# Patient Record
Sex: Male | Born: 1970 | Race: Black or African American | Hispanic: No | Marital: Single | Smoking: Current every day smoker
Health system: Southern US, Community
[De-identification: ages and names within clinical notes are randomized; demographics above are authoritative.]

## PROBLEM LIST (undated history)

## (undated) ENCOUNTER — Emergency Department: Admission: EM | Payer: No Typology Code available for payment source

## (undated) ENCOUNTER — Emergency Department: Admission: EM | Discharge status: Absent without leave | Payer: No Typology Code available for payment source

## (undated) DIAGNOSIS — Z5900 Homelessness unspecified: Secondary | ICD-10-CM

## (undated) DIAGNOSIS — F329 Major depressive disorder, single episode, unspecified: Secondary | ICD-10-CM

## (undated) DIAGNOSIS — F319 Bipolar disorder, unspecified: Secondary | ICD-10-CM

## (undated) DIAGNOSIS — E119 Type 2 diabetes mellitus without complications: Secondary | ICD-10-CM

## (undated) DIAGNOSIS — F32A Depression, unspecified: Secondary | ICD-10-CM

## (undated) DIAGNOSIS — F209 Schizophrenia, unspecified: Secondary | ICD-10-CM

## (undated) DIAGNOSIS — J45909 Unspecified asthma, uncomplicated: Secondary | ICD-10-CM

## (undated) DIAGNOSIS — F419 Anxiety disorder, unspecified: Secondary | ICD-10-CM

---

## 2000-01-13 ENCOUNTER — Emergency Department (HOSPITAL_COMMUNITY): Admission: EM | Admit: 2000-01-13 | Discharge: 2000-01-13 | Payer: Self-pay | Admitting: Emergency Medicine

## 2000-04-15 ENCOUNTER — Inpatient Hospital Stay (HOSPITAL_COMMUNITY): Admission: EM | Admit: 2000-04-15 | Discharge: 2000-04-17 | Payer: Self-pay | Admitting: Emergency Medicine

## 2000-04-15 ENCOUNTER — Encounter: Payer: Self-pay | Admitting: Emergency Medicine

## 2000-04-17 ENCOUNTER — Encounter: Payer: Self-pay | Admitting: Internal Medicine

## 2000-10-14 ENCOUNTER — Emergency Department (HOSPITAL_COMMUNITY): Admission: EM | Admit: 2000-10-14 | Discharge: 2000-10-15 | Payer: Self-pay | Admitting: Emergency Medicine

## 2000-10-15 ENCOUNTER — Encounter: Payer: Self-pay | Admitting: Emergency Medicine

## 2000-12-17 ENCOUNTER — Emergency Department (HOSPITAL_COMMUNITY): Admission: EM | Admit: 2000-12-17 | Discharge: 2000-12-17 | Payer: Self-pay

## 2001-02-01 ENCOUNTER — Emergency Department (HOSPITAL_COMMUNITY): Admission: EM | Admit: 2001-02-01 | Discharge: 2001-02-02 | Payer: Self-pay | Admitting: Emergency Medicine

## 2001-02-02 ENCOUNTER — Encounter: Payer: Self-pay | Admitting: Emergency Medicine

## 2002-12-17 ENCOUNTER — Emergency Department (HOSPITAL_COMMUNITY): Admission: EM | Admit: 2002-12-17 | Discharge: 2002-12-17 | Payer: Self-pay | Admitting: Emergency Medicine

## 2003-08-05 ENCOUNTER — Encounter: Payer: Self-pay | Admitting: Family Medicine

## 2003-08-05 ENCOUNTER — Ambulatory Visit (HOSPITAL_COMMUNITY): Admission: RE | Admit: 2003-08-05 | Discharge: 2003-08-05 | Payer: Self-pay | Admitting: Family Medicine

## 2005-06-06 ENCOUNTER — Emergency Department (HOSPITAL_COMMUNITY): Admission: EM | Admit: 2005-06-06 | Discharge: 2005-06-06 | Payer: Self-pay | Admitting: Emergency Medicine

## 2007-01-11 ENCOUNTER — Emergency Department (HOSPITAL_COMMUNITY): Admission: EM | Admit: 2007-01-11 | Discharge: 2007-01-11 | Payer: Self-pay | Admitting: Emergency Medicine

## 2007-01-14 ENCOUNTER — Emergency Department (HOSPITAL_COMMUNITY): Admission: EM | Admit: 2007-01-14 | Discharge: 2007-01-15 | Payer: Self-pay | Admitting: Emergency Medicine

## 2007-01-16 ENCOUNTER — Emergency Department (HOSPITAL_COMMUNITY): Admission: EM | Admit: 2007-01-16 | Discharge: 2007-01-16 | Payer: Self-pay | Admitting: Certified Registered"

## 2007-01-17 ENCOUNTER — Emergency Department (HOSPITAL_COMMUNITY): Admission: EM | Admit: 2007-01-17 | Discharge: 2007-01-17 | Payer: Self-pay | Admitting: Emergency Medicine

## 2007-01-22 ENCOUNTER — Encounter: Admission: RE | Admit: 2007-01-22 | Discharge: 2007-01-22 | Payer: Self-pay | Admitting: Family Medicine

## 2013-06-11 ENCOUNTER — Encounter (HOSPITAL_COMMUNITY): Payer: Self-pay | Admitting: Emergency Medicine

## 2013-06-11 ENCOUNTER — Emergency Department (HOSPITAL_COMMUNITY)
Admission: EM | Admit: 2013-06-11 | Discharge: 2013-06-11 | Disposition: A | Discharge status: Home / Self Care | Payer: Medicare Other | Attending: Emergency Medicine | Admitting: Emergency Medicine

## 2013-06-11 DIAGNOSIS — F411 Generalized anxiety disorder: Secondary | ICD-10-CM | POA: Insufficient documentation

## 2013-06-11 DIAGNOSIS — B353 Tinea pedis: Secondary | ICD-10-CM

## 2013-06-11 DIAGNOSIS — Z79899 Other long term (current) drug therapy: Secondary | ICD-10-CM | POA: Insufficient documentation

## 2013-06-11 DIAGNOSIS — L299 Pruritus, unspecified: Secondary | ICD-10-CM | POA: Insufficient documentation

## 2013-06-11 DIAGNOSIS — F17200 Nicotine dependence, unspecified, uncomplicated: Secondary | ICD-10-CM | POA: Insufficient documentation

## 2013-06-11 DIAGNOSIS — F209 Schizophrenia, unspecified: Secondary | ICD-10-CM | POA: Insufficient documentation

## 2013-06-11 DIAGNOSIS — R630 Anorexia: Secondary | ICD-10-CM | POA: Insufficient documentation

## 2013-06-11 DIAGNOSIS — L988 Other specified disorders of the skin and subcutaneous tissue: Secondary | ICD-10-CM | POA: Insufficient documentation

## 2013-06-11 DIAGNOSIS — R634 Abnormal weight loss: Secondary | ICD-10-CM | POA: Insufficient documentation

## 2013-06-11 HISTORY — DX: Anxiety disorder, unspecified: F41.9

## 2013-06-11 MED ORDER — MICONAZOLE NITRATE 2 % EX CREA
1.0000 "application " | TOPICAL_CREAM | Freq: Two times a day (BID) | CUTANEOUS | Status: DC
  Administered 2013-06-11: 1 via TOPICAL
  Filled 2013-06-11: qty 14

## 2013-06-11 MED ORDER — HYDROCODONE-ACETAMINOPHEN 5-325 MG PO TABS
1.0000 | ORAL_TABLET | Freq: Once | ORAL | Status: AC
  Administered 2013-06-11: 1 via ORAL
  Filled 2013-06-11: qty 1

## 2013-06-11 NOTE — ED Notes (Signed)
PT. REPORTS BILATERAL FOOT PAIN FOR 9 MONTHS , AMBULATORY , DENIES INJURY OR FALL , ALSO REPORTS DRY / CRACKED / ITCHY SKIN ( FUNGUS ) AT BOTH FEET.

## 2013-06-11 NOTE — ED Provider Notes (Signed)
CSN: [id number]     Arrival date & time 06/11/13  2107 History    This chart was scribed for Hunter Morn, NP working with Lyanne Co, MD by Quintella Reichert, ED Scribe. This patient was seen in room TR07C/TR07C and the patient's care was started at 10:14 PM.     Chief Complaint  Patient presents with  . Foot Pain    Patient is a 42 y.o. male presenting with lower extremity pain. The history is provided by the patient. No language interpreter was used.  Foot Pain This is a new problem. Episode onset: 9 months ago. The problem occurs constantly. The problem has been gradually worsening. Associated symptoms comments: Dry, flaking, itching and cracked skin. Exacerbated by: Applying pressure to the soles and bearing weight. Nothing relieves the symptoms. Treatments tried: Epsom salt soak. The treatment provided no relief.    HPI Comments: Hunter Monroe is a 42 y.o. male with h/o anxiety and schizophrenia who presents to the Emergency Department complaining of 9 months of pain to the soles of both feet.  Pt states that the areas are tender, with pain exacerbated by applying pressure and by bearing weight.  He is ambulatory.  Pt also reports dry, cracked, itchy and flaking skin at the bottom of both feet.  He has attempted to treat symptoms with epsom salt, without relief.  He denies prior h/o similar symptoms.  He is currently homeless and states he walks long distances regularly, and admits to occasionally allowing his feet to stay wet.  He denies h/o fungal infections on feet.  Pt also notes he has had a decreased appetite recently and has been losing weight.  He is not sexually active.  He medicates regularly with Lexapro and Latuda for his psychiatric conditions.  He denies any other chronic medical conditions to his knowledge.     Past Medical History  Diagnosis Date  . Anxiety     History reviewed. No pertinent past surgical history.   No family history on file.   History   Substance Use Topics  . Smoking status: Current Every Day Smoker  . Smokeless tobacco: Not on file  . Alcohol Use: No     Review of Systems  Constitutional: Positive for appetite change and unexpected weight change.  Musculoskeletal:       Pain to soles of feet  Skin:       Dried, flaking, itchy cracked skin on bottoms of feet  All other systems reviewed and are negative.      Allergies  Review of patient's allergies indicates no known allergies.  Home Medications   Current Outpatient Rx  Name  Route  Sig  Dispense  Refill  . escitalopram (LEXAPRO) 20 MG tablet   Oral   Take 20 mg by mouth daily.         . Lurasidone HCl (LATUDA) 20 MG TABS   Oral   Take 20 mg by mouth daily.          BP 128/73  Pulse 70  Temp(Src) 98.8 F (37.1 C) (Oral)  Resp 16  SpO2 98%  Physical Exam  Nursing note and vitals reviewed. Constitutional: He is oriented to person, place, and time. He appears well-developed and well-nourished. No distress.  HENT:  Head: Normocephalic and atraumatic.  Eyes: EOM are normal.  Neck: Neck supple. No tracheal deviation present.  Cardiovascular: Normal rate.   Pulmonary/Chest: Effort normal. No respiratory distress.  Musculoskeletal: Normal range of motion.  Neurological: He  is alert and oriented to person, place, and time.  Skin: Skin is warm and dry.  Dry scaly skin on soles of feet.  Psychiatric: He has a normal mood and affect. His behavior is normal.    ED Course  Procedures (including critical care time)  DIAGNOSTIC STUDIES: Oxygen Saturation is 98% on room air, normal by my interpretation.    COORDINATION OF CARE: 10:20 PM-Discussed treatment plan which includes topical cream for symptomatic relief and referral to PCP with pt at bedside and pt agreed to plan.    Labs Reviewed - No data to display No results found. No diagnosis found.  MDM  ? Tinea infection of feet.  Will trial miconazole. PCP follow-up.     I  personally performed the services described in this documentation, which was scribed in my presence. The recorded information has been reviewed and is accurate.    Jimmye Norman, NP 06/11/13 [phone]

## 2013-06-14 NOTE — ED Provider Notes (Signed)
Medical screening examination/treatment/procedure(s) were performed by non-physician practitioner and as supervising physician I was immediately available for consultation/collaboration.   Lyanne Co, MD 06/14/13 [phone]

## 2013-08-04 ENCOUNTER — Emergency Department (HOSPITAL_COMMUNITY)
Admission: EM | Admit: 2013-08-04 | Discharge: 2013-08-04 | Disposition: A | Discharge status: Home / Self Care | Payer: Medicare Other | Attending: Emergency Medicine | Admitting: Emergency Medicine

## 2013-08-04 ENCOUNTER — Encounter (HOSPITAL_COMMUNITY): Payer: Self-pay | Admitting: Emergency Medicine

## 2013-08-04 DIAGNOSIS — Z5900 Homelessness unspecified: Secondary | ICD-10-CM | POA: Insufficient documentation

## 2013-08-04 DIAGNOSIS — M722 Plantar fascial fibromatosis: Secondary | ICD-10-CM | POA: Insufficient documentation

## 2013-08-04 DIAGNOSIS — Z79899 Other long term (current) drug therapy: Secondary | ICD-10-CM | POA: Insufficient documentation

## 2013-08-04 DIAGNOSIS — Z8659 Personal history of other mental and behavioral disorders: Secondary | ICD-10-CM | POA: Insufficient documentation

## 2013-08-04 DIAGNOSIS — F17200 Nicotine dependence, unspecified, uncomplicated: Secondary | ICD-10-CM | POA: Insufficient documentation

## 2013-08-04 DIAGNOSIS — J45909 Unspecified asthma, uncomplicated: Secondary | ICD-10-CM | POA: Insufficient documentation

## 2013-08-04 DIAGNOSIS — E119 Type 2 diabetes mellitus without complications: Secondary | ICD-10-CM | POA: Insufficient documentation

## 2013-08-04 DIAGNOSIS — F209 Schizophrenia, unspecified: Secondary | ICD-10-CM | POA: Insufficient documentation

## 2013-08-04 HISTORY — DX: Schizophrenia, unspecified: F20.9

## 2013-08-04 HISTORY — DX: Depression, unspecified: F32.A

## 2013-08-04 HISTORY — DX: Major depressive disorder, single episode, unspecified: F32.9

## 2013-08-04 HISTORY — DX: Unspecified asthma, uncomplicated: J45.909

## 2013-08-04 HISTORY — DX: Homelessness: Z59.0

## 2013-08-04 HISTORY — DX: Homelessness unspecified: Z59.00

## 2013-08-04 HISTORY — DX: Type 2 diabetes mellitus without complications: E11.9

## 2013-08-04 HISTORY — DX: Bipolar disorder, unspecified: F31.9

## 2013-08-04 MED ORDER — IBUPROFEN 400 MG PO TABS
800.0000 mg | ORAL_TABLET | Freq: Once | ORAL | Status: AC
  Administered 2013-08-04: 800 mg via ORAL
  Filled 2013-08-04: qty 2

## 2013-08-04 MED ORDER — MELOXICAM 15 MG PO TABS: 15.0000 mg | ORAL_TABLET | Freq: Every day | ORAL | Status: DC

## 2013-08-04 NOTE — ED Provider Notes (Signed)
Medical screening examination/treatment/procedure(s) were performed by non-physician practitioner and as supervising physician I was immediately available for consultation/collaboration.   Loren Racer, MD 08/04/13 [phone]

## 2013-08-04 NOTE — ED Notes (Signed)
Pt. reports bilateral foot pain for several days , pt. stated he is homeless  and walks a lot .

## 2013-08-04 NOTE — ED Provider Notes (Signed)
CSN: [id number]     Arrival date & time 08/04/13  0017 History   First MD Initiated Contact with Patient 08/04/13 0103     Chief Complaint  Patient presents with  . Foot Pain   HPI  History provided by the patient. The patient is a 42 year old male with past history of schizophrenia, anxiety and asthma who presents with complaints of bilateral foot pain. Patient reports worsening foot pain after walking significant distances. The patient states he recently walked from [place] to [place]. He is homeless and states he does not have a place to live currently. Patient was seen previously in the emergency room for similar symptoms of foot pain. He reports using a cream that he was given without any change in symptoms. He has not been able to take any pills for the pain. Pain is worse with increased walking and movement and better with resting. Denies any other aggravating or alleviating factors. Denies any swelling of the legs and feet. No redness of the skin. No fever, chills or sweats. No other associated symptoms.    Past Medical History  Diagnosis Date  . Anxiety   . Schizophrenia   . Bipolar 1 disorder   . Depression   . Diabetes mellitus without complication   . Asthma   . Homelessness    History reviewed. No pertinent past surgical history. No family history on file. History  Substance Use Topics  . Smoking status: Current Every Day Smoker  . Smokeless tobacco: Not on file  . Alcohol Use: No    Review of Systems  Constitutional: Negative for fever, chills and diaphoresis.  Respiratory: Negative for shortness of breath.   Cardiovascular: Negative for chest pain.  Skin: Negative for rash.  All other systems reviewed and are negative.    Allergies  Review of patient's allergies indicates no known allergies.  Home Medications   Current Outpatient Rx  Name  Route  Sig  Dispense  Refill  . paliperidone (INVEGA) 6 MG 24 hr tablet   Oral   Take 6 mg by mouth every  morning.          BP 103/82  Pulse 94  Temp(Src) 98.7 F (37.1 C) (Oral)  Resp 14  Ht 5\' 7"  (1.702 m)  Wt 129 lb (58.514 kg)  BMI 20.2 kg/m2  SpO2 96% Physical Exam  Nursing note and vitals reviewed. Constitutional: He is oriented to person, place, and time. He appears well-developed and well-nourished. No distress.  HENT:  Head: Normocephalic and atraumatic.  Cardiovascular: Normal rate and regular rhythm.   Pulmonary/Chest: Effort normal and breath sounds normal. No respiratory distress. He has no wheezes. He has no rales.  Abdominal: Soft.  Musculoskeletal: Normal range of motion.  Significant dry and scaling skin of the feet bilaterally. There is some cracking fissuring between the digits. He has significant tenderness along the plantar surface. No significant swelling or erythema. Normal pulses and sensations to the toes.  Neurological: He is alert and oriented to person, place, and time.  Skin: Skin is warm. No erythema.  Psychiatric: He has a normal mood and affect. His behavior is normal.    ED Course  Procedures       MDM   1. Plantar fasciitis      Patient seen and evaluated. Patient appears well. Patient is homeless requesting for a place to stay. We'll plan to provide resources for your by homeless shelters.    Angus Seller, PA-C 08/04/13 0530

## 2015-01-07 ENCOUNTER — Encounter (HOSPITAL_COMMUNITY): Payer: Self-pay | Admitting: Family Medicine

## 2015-01-07 ENCOUNTER — Emergency Department (HOSPITAL_COMMUNITY)
Admission: EM | Admit: 2015-01-07 | Discharge: 2015-01-07 | Disposition: A | Discharge status: Home / Self Care | Payer: Medicare Other | Attending: Emergency Medicine | Admitting: Emergency Medicine

## 2015-01-07 ENCOUNTER — Emergency Department (HOSPITAL_COMMUNITY): Payer: Medicare Other

## 2015-01-07 DIAGNOSIS — Z79899 Other long term (current) drug therapy: Secondary | ICD-10-CM | POA: Diagnosis not present

## 2015-01-07 DIAGNOSIS — F319 Bipolar disorder, unspecified: Secondary | ICD-10-CM | POA: Insufficient documentation

## 2015-01-07 DIAGNOSIS — M79672 Pain in left foot: Secondary | ICD-10-CM | POA: Diagnosis not present

## 2015-01-07 DIAGNOSIS — R21 Rash and other nonspecific skin eruption: Secondary | ICD-10-CM | POA: Diagnosis not present

## 2015-01-07 DIAGNOSIS — F419 Anxiety disorder, unspecified: Secondary | ICD-10-CM | POA: Insufficient documentation

## 2015-01-07 DIAGNOSIS — Z720 Tobacco use: Secondary | ICD-10-CM | POA: Diagnosis not present

## 2015-01-07 DIAGNOSIS — Z590 Homelessness: Secondary | ICD-10-CM | POA: Insufficient documentation

## 2015-01-07 DIAGNOSIS — J45909 Unspecified asthma, uncomplicated: Secondary | ICD-10-CM | POA: Insufficient documentation

## 2015-01-07 DIAGNOSIS — F17200 Nicotine dependence, unspecified, uncomplicated: Secondary | ICD-10-CM

## 2015-01-07 DIAGNOSIS — F209 Schizophrenia, unspecified: Secondary | ICD-10-CM | POA: Insufficient documentation

## 2015-01-07 DIAGNOSIS — E119 Type 2 diabetes mellitus without complications: Secondary | ICD-10-CM | POA: Diagnosis not present

## 2015-01-07 DIAGNOSIS — M79671 Pain in right foot: Secondary | ICD-10-CM | POA: Diagnosis present

## 2015-01-07 LAB — CBC WITH DIFFERENTIAL/PLATELET
BASOS PCT: 0 % (ref 0–1)
Basophils Absolute: 0 10*3/uL (ref 0.0–0.1)
EOS PCT: 2 % (ref 0–5)
Eosinophils Absolute: 0.1 10*3/uL (ref 0.0–0.7)
HEMATOCRIT: 41.2 % (ref 39.0–52.0)
Hemoglobin: 13.3 g/dL (ref 13.0–17.0)
LYMPHS ABS: 2.7 10*3/uL (ref 0.7–4.0)
Lymphocytes Relative: 37 % (ref 12–46)
MCH: 23.4 pg — AB (ref 26.0–34.0)
MCHC: 32.3 g/dL (ref 30.0–36.0)
MCV: 72.5 fL — AB (ref 78.0–100.0)
MONO ABS: 0.5 10*3/uL (ref 0.1–1.0)
MONOS PCT: 7 % (ref 3–12)
NEUTROS ABS: 4 10*3/uL (ref 1.7–7.7)
Neutrophils Relative %: 54 % (ref 43–77)
PLATELETS: 208 10*3/uL (ref 150–400)
RBC: 5.68 MIL/uL (ref 4.22–5.81)
RDW: 14.7 % (ref 11.5–15.5)
WBC: 7.3 10*3/uL (ref 4.0–10.5)

## 2015-01-07 LAB — BASIC METABOLIC PANEL
Anion gap: 10 (ref 5–15)
BUN: 8 mg/dL (ref 6–23)
CALCIUM: 9.7 mg/dL (ref 8.4–10.5)
CO2: 27 mmol/L (ref 19–32)
CREATININE: 0.86 mg/dL (ref 0.50–1.35)
Chloride: 106 mmol/L (ref 96–112)
GFR calc Af Amer: >90 mL/min (ref >90)
GLUCOSE: 95 mg/dL (ref 70–99)
Potassium: 4 mmol/L (ref 3.5–5.1)
Sodium: 143 mmol/L (ref 135–145)

## 2015-01-07 LAB — CBG MONITORING, ED: GLUCOSE-CAPILLARY: 116 mg/dL — AB (ref 70–99)

## 2015-01-07 MED ORDER — MICONAZOLE NITRATE 2 % EX AERP: INHALATION_SPRAY | CUTANEOUS | Status: DC

## 2015-01-07 MED ORDER — HYDROCODONE-ACETAMINOPHEN 5-325 MG PO TABS: ORAL_TABLET | ORAL | Status: DC

## 2015-01-07 MED ORDER — HYDROCODONE-ACETAMINOPHEN 5-325 MG PO TABS
1.0000 | ORAL_TABLET | Freq: Once | ORAL | Status: AC
  Administered 2015-01-07: 1 via ORAL
  Filled 2015-01-07: qty 1

## 2015-01-07 MED ORDER — CEPHALEXIN 500 MG PO CAPS: 500.0000 mg | ORAL_CAPSULE | Freq: Four times a day (QID) | ORAL | Status: DC

## 2015-01-07 NOTE — Discharge Instructions (Signed)
Can walk into the wellness Center to set up primary care. Please see the podiatrist as instructed. Do not hesitate to return to the ED for any new, worsening or concerning symptoms.

## 2015-01-07 NOTE — ED Notes (Addendum)
Pt presents from home via POV with c/o bilateral foot pain with skin color changes to left 4th and 5th toes.  Pt reports was diagnosed with borderline diabetes 3 years ago and has not had follow up since (CBG 116 today). Pt also reports foot pain x3 years, but states skin color darkened on 3rd and 4th toes about 1 month ago. Pedal pulses strong.

## 2015-01-07 NOTE — ED Notes (Signed)
Pt comfortable with discharge and follow up instructions. Pt declines wheelchair, escorted to waiting area by this RN. Prescriptions x3. 

## 2015-01-07 NOTE — Discharge Planning (Signed)
Chloe Bluett J. Clydene Laming, RN, [place], [place] [phone] ED CM consulted to meet with patient concerning f/u care with PCP and patient has Medicare. Pt presented to [facility] ED today with foot pain.  Met with patient at bedside, confirmed informaton. Pt  reports not having access to f/u care with PCP. Discussed with patient importance and benefits of  establishing PCP, and not utilizing the ED for primary care needs. Pt verbalized understanding and is in agreement. Discussed other options, provided list of local  affordable PCPs.  Pt voiced interest in the [facility] and [facility] Brochure given with address, phone number, and the services highlighted. Explained that there is a Customer service manager on site who will assist with [employer] and process. Instructed to call or walk in Monday - Friday between the hours of 9- 5:30p to schedule appt for establishing care for PCP. Pt verbalized understanding.

## 2015-01-07 NOTE — ED Provider Notes (Signed)
CSN: [id number]     Arrival date & time 01/07/15  0920 History   First MD Initiated Contact with Patient 01/07/15 [phone]     Chief Complaint  Patient presents with  . Foot Pain     (Consider location/radiation/quality/duration/timing/severity/associated sxs/prior Treatment) HPI   Hunter Monroe is a 44 y.o. male complaining of bilateral foot pain worse on the left than the right with darkening of the left great toe which she noticed proximally 3 weeks ago, states it has been increasing. States he has mild/moderate pain and ambulation. He is prediabetic, has not followed with primary care, he is insured. He denies numbness, weakness, fever, chills,   Past Medical History  Diagnosis Date  . Anxiety   . Schizophrenia   . Bipolar 1 disorder   . Depression   . Diabetes mellitus without complication   . Asthma   . Homelessness    History reviewed. No pertinent past surgical history. No family history on file. History  Substance Use Topics  . Smoking status: Current Every Day Smoker  . Smokeless tobacco: Not on file  . Alcohol Use: No    Review of Systems  10 systems reviewed and found to be negative, except as noted in the HPI.   Allergies  Review of patient's allergies indicates no known allergies.  Home Medications   Prior to Admission medications   Medication Sig Start Date End Date Taking? Authorizing Provider  buPROPion (WELLBUTRIN XL) 300 MG 24 hr tablet Take 300 mg by mouth daily.   Yes Historical Provider, MD  omega-3 acid ethyl esters (LOVAZA) 1 G capsule Take 1 g by mouth daily.   Yes Historical Provider, MD  paliperidone (INVEGA) 9 MG 24 hr tablet Take 9 mg by mouth every morning.   Yes Historical Provider, MD  cephALEXin (KEFLEX) 500 MG capsule Take 1 capsule (500 mg total) by mouth 4 (four) times daily. 01/07/15   Azra Abrell, PA-C  HYDROcodone-acetaminophen (NORCO/VICODIN) 5-325 MG per tablet Take 1-2 tablets by mouth every 6 hours as needed for pain and/or  cough. 01/07/15   Davante Gerke, PA-C  meloxicam (MOBIC) 15 MG tablet Take 1 tablet (15 mg total) by mouth daily. Patient not taking: Reported on 01/07/2015 08/04/13   Ivonne AndrewPeter Dammen, PA-C  Miconazole Nitrate 2 % AERP As directed in instructions 01/07/15   Joni ReiningNicole Raelene Trew, PA-C   BP 136/87 mmHg  Pulse 70  Temp(Src) 98.1 F (36.7 C) (Oral)  Resp 18  SpO2 98% Physical Exam  Constitutional: He is oriented to person, place, and time. He appears well-developed and well-nourished. No distress.  HENT:  Head: Normocephalic and atraumatic.  Mouth/Throat: Oropharynx is clear and moist.  Eyes: Conjunctivae and EOM are normal. Pupils are equal, round, and reactive to light.  Neck: Normal range of motion.  Cardiovascular: Normal rate, regular rhythm and intact distal pulses.   Pulmonary/Chest: Effort normal. No stridor. No respiratory distress. He has no wheezes. He has no rales. He exhibits no tenderness.  Abdominal: Soft. Bowel sounds are normal.  Musculoskeletal: Normal range of motion.  Neurological: He is alert and oriented to person, place, and time.  Skin: Rash noted.  Several partial-thickness ulcerations to bilateral feet, there is no surrounding cellulitis, no purulent discharge.  Area of hyperpigmentation between the fourth and fifth digit.  Dorsalis pedis 2+.  Psychiatric: He has a normal mood and affect.  Nursing note and vitals reviewed.       ED Course  Procedures (including critical care time) Labs Review  Labs Reviewed  CBC WITH DIFFERENTIAL/PLATELET - Abnormal; Notable for the following:    MCV 72.5 (*)    MCH 23.4 (*)    All other components within normal limits  CBG MONITORING, ED - Abnormal; Notable for the following:    Glucose-Capillary 116 (*)    All other components within normal limits  BASIC METABOLIC PANEL    Imaging Review Dg Foot Complete Left  01/07/2015   CLINICAL DATA:  Diffuse left foot pain. Sore on the lateral Mid great toe. History of  diabetes.  EXAM: LEFT FOOT - COMPLETE 3+ VIEW  COMPARISON:  12/1906  FINDINGS: No acute fracture or dislocation. No radiopaque foreign object. No osseous destruction. No well-defined soft tissue ulcer identified.  IMPRESSION: Normal left foot.   Electronically Signed   By: Jeronimo Greaves M.D.   On: 01/07/2015 10:32     EKG Interpretation None      MDM   Final diagnoses:  Foot pain, bilateral  Rash  Tobacco use disorder    Filed Vitals:   01/07/15 1000 01/07/15 1132 01/07/15 1145 01/07/15 1153  BP: 137/89 136/83 136/87   Pulse: 87 71 70   Temp:    98.1 F (36.7 C)  TempSrc:    Oral  Resp:  16 18   SpO2: 98% 99% 98%     Hunter Monroe is a pleasant 44 y.o. male presenting with bilateral foot pain worse on the left than the right with hyperpigmentation to the left fourth and fifth digits. Patient reports pain and ambulation, he is afebrile and well-appearing. X-ray with no signs of osteomyelitis. Normal blood work.  Patient's Medicare card says [facility], I have our case management in to connect him with a primary care. I'll also give podiatry referral.  Evaluation does not show pathology that would require ongoing emergent intervention or inpatient treatment. Pt is hemodynamically stable and mentating appropriately. Discussed findings and plan with patient/guardian, who agrees with care plan. All questions answered. Return precautions discussed and outpatient follow up given.   Discharge Medication List as of 01/07/2015 11:44 AM    START taking these medications   Details  cephALEXin (KEFLEX) 500 MG capsule Take 1 capsule (500 mg total) by mouth 4 (four) times daily., Starting 01/07/2015, Until Discontinued, Print    HYDROcodone-acetaminophen (NORCO/VICODIN) 5-325 MG per tablet Take 1-2 tablets by mouth every 6 hours as needed for pain and/or cough., Print    Miconazole Nitrate 2 % AERP As directed in instructions, Print             Wynetta Emery,  PA-C 01/07/15 1446  Gilda Crease, MD 01/07/15 1732

## 2015-06-30 ENCOUNTER — Emergency Department (HOSPITAL_COMMUNITY)
Admission: EM | Admit: 2015-06-30 | Discharge: 2015-06-30 | Discharge status: Against Medical Advice | Payer: Medicare Other | Attending: Emergency Medicine | Admitting: Emergency Medicine

## 2015-06-30 ENCOUNTER — Encounter (HOSPITAL_COMMUNITY): Payer: Self-pay | Admitting: *Deleted

## 2015-06-30 DIAGNOSIS — R21 Rash and other nonspecific skin eruption: Secondary | ICD-10-CM | POA: Diagnosis present

## 2015-06-30 DIAGNOSIS — Z720 Tobacco use: Secondary | ICD-10-CM | POA: Insufficient documentation

## 2015-06-30 DIAGNOSIS — J45909 Unspecified asthma, uncomplicated: Secondary | ICD-10-CM | POA: Insufficient documentation

## 2015-06-30 DIAGNOSIS — E119 Type 2 diabetes mellitus without complications: Secondary | ICD-10-CM | POA: Insufficient documentation

## 2015-06-30 NOTE — ED Notes (Signed)
Patient presents stating the rash on his hands and feet has been going on for about 8-10 months.

## 2015-06-30 NOTE — ED Notes (Signed)
Dr Norlene Campbell poked her head into the room nd explained the delay to the pt.

## 2016-03-14 ENCOUNTER — Encounter (HOSPITAL_COMMUNITY): Payer: Self-pay

## 2016-03-14 ENCOUNTER — Emergency Department (HOSPITAL_COMMUNITY)
Admission: EM | Admit: 2016-03-14 | Discharge: 2016-03-14 | Disposition: A | Discharge status: Home / Self Care | Payer: No Typology Code available for payment source | Attending: Emergency Medicine | Admitting: Emergency Medicine

## 2016-03-14 ENCOUNTER — Emergency Department (HOSPITAL_COMMUNITY): Payer: No Typology Code available for payment source

## 2016-03-14 DIAGNOSIS — Z792 Long term (current) use of antibiotics: Secondary | ICD-10-CM | POA: Diagnosis not present

## 2016-03-14 DIAGNOSIS — F419 Anxiety disorder, unspecified: Secondary | ICD-10-CM | POA: Diagnosis not present

## 2016-03-14 DIAGNOSIS — J45909 Unspecified asthma, uncomplicated: Secondary | ICD-10-CM | POA: Diagnosis not present

## 2016-03-14 DIAGNOSIS — Y9389 Activity, other specified: Secondary | ICD-10-CM | POA: Insufficient documentation

## 2016-03-14 DIAGNOSIS — Y92410 Unspecified street and highway as the place of occurrence of the external cause: Secondary | ICD-10-CM | POA: Diagnosis not present

## 2016-03-14 DIAGNOSIS — F319 Bipolar disorder, unspecified: Secondary | ICD-10-CM | POA: Diagnosis not present

## 2016-03-14 DIAGNOSIS — F17200 Nicotine dependence, unspecified, uncomplicated: Secondary | ICD-10-CM | POA: Insufficient documentation

## 2016-03-14 DIAGNOSIS — Z79899 Other long term (current) drug therapy: Secondary | ICD-10-CM | POA: Diagnosis not present

## 2016-03-14 DIAGNOSIS — S79912A Unspecified injury of left hip, initial encounter: Secondary | ICD-10-CM | POA: Diagnosis present

## 2016-03-14 DIAGNOSIS — S29002A Unspecified injury of muscle and tendon of back wall of thorax, initial encounter: Secondary | ICD-10-CM | POA: Insufficient documentation

## 2016-03-14 DIAGNOSIS — Z590 Homelessness: Secondary | ICD-10-CM | POA: Insufficient documentation

## 2016-03-14 DIAGNOSIS — E119 Type 2 diabetes mellitus without complications: Secondary | ICD-10-CM | POA: Insufficient documentation

## 2016-03-14 DIAGNOSIS — F209 Schizophrenia, unspecified: Secondary | ICD-10-CM | POA: Insufficient documentation

## 2016-03-14 DIAGNOSIS — Y998 Other external cause status: Secondary | ICD-10-CM | POA: Insufficient documentation

## 2016-03-14 DIAGNOSIS — S3991XA Unspecified injury of abdomen, initial encounter: Secondary | ICD-10-CM | POA: Diagnosis not present

## 2016-03-14 MED ORDER — NAPROXEN 500 MG PO TABS: 500.0000 mg | ORAL_TABLET | Freq: Two times a day (BID) | ORAL | Status: DC

## 2016-03-14 MED ORDER — NAPROXEN 250 MG PO TABS
500.0000 mg | ORAL_TABLET | Freq: Once | ORAL | Status: AC
Start: 2016-03-14 — End: 2016-03-14
  Administered 2016-03-14: 500 mg via ORAL
  Filled 2016-03-14: qty 2

## 2016-03-14 MED ORDER — DIAZEPAM 5 MG PO TABS: 5.0000 mg | ORAL_TABLET | Freq: Two times a day (BID) | ORAL | Status: DC

## 2016-03-14 MED ORDER — DIAZEPAM 5 MG PO TABS
5.0000 mg | ORAL_TABLET | Freq: Once | ORAL | Status: AC
  Administered 2016-03-14: 5 mg via ORAL
  Filled 2016-03-14: qty 1

## 2016-03-14 NOTE — ED Notes (Signed)
Pt stable, ambulatory, states understanding of discharge iinstructions 
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
## 2016-04-30 ENCOUNTER — Emergency Department
Admission: EM | Admit: 2016-04-30 | Discharge: 2016-04-30 | Disposition: A | Discharge status: Home / Self Care | Payer: Medicare Other | Attending: Emergency Medicine | Admitting: Emergency Medicine

## 2016-04-30 ENCOUNTER — Encounter: Payer: Self-pay | Admitting: Emergency Medicine

## 2016-04-30 DIAGNOSIS — F329 Major depressive disorder, single episode, unspecified: Secondary | ICD-10-CM | POA: Diagnosis not present

## 2016-04-30 DIAGNOSIS — J45909 Unspecified asthma, uncomplicated: Secondary | ICD-10-CM | POA: Insufficient documentation

## 2016-04-30 DIAGNOSIS — F209 Schizophrenia, unspecified: Secondary | ICD-10-CM | POA: Diagnosis not present

## 2016-04-30 DIAGNOSIS — Z202 Contact with and (suspected) exposure to infections with a predominantly sexual mode of transmission: Secondary | ICD-10-CM | POA: Insufficient documentation

## 2016-04-30 DIAGNOSIS — R21 Rash and other nonspecific skin eruption: Secondary | ICD-10-CM

## 2016-04-30 DIAGNOSIS — E119 Type 2 diabetes mellitus without complications: Secondary | ICD-10-CM | POA: Insufficient documentation

## 2016-04-30 DIAGNOSIS — F17210 Nicotine dependence, cigarettes, uncomplicated: Secondary | ICD-10-CM | POA: Insufficient documentation

## 2016-04-30 LAB — CHLAMYDIA/NGC RT PCR (ARMC ONLY)
CHLAMYDIA TR: NOT DETECTED
N gonorrhoeae: NOT DETECTED

## 2016-04-30 MED ORDER — CEFIXIME 400 MG PO TABS
400.0000 mg | ORAL_TABLET | Freq: Once | ORAL | Status: AC
  Administered 2016-04-30: 400 mg via ORAL
  Filled 2016-04-30: qty 1

## 2016-04-30 MED ORDER — CEFIXIME 400 MG PO TABS: 400.0000 mg | ORAL_TABLET | Freq: Two times a day (BID) | ORAL | Status: DC

## 2016-04-30 MED ORDER — CLOTRIMAZOLE 1 % EX CREA: 1.0000 "application " | TOPICAL_CREAM | Freq: Two times a day (BID) | CUTANEOUS | Status: DC

## 2016-04-30 NOTE — ED Notes (Signed)
Pt reports he has been having unprotected sex and now has a rash on both legs that burns - The rash has been present for 1-2 months - He decided to come to the ER tonight because sweating on the area was causing an increase in the burning sensation - Pt denies pain or frequency with urination

## 2016-04-30 NOTE — ED Notes (Signed)
Pt presents to ED with c/o rash to groin for a "month or two." Pt reports has been having unprotected intercourse and states wants to get tested for STD.

## 2016-04-30 NOTE — Discharge Instructions (Signed)
Sexually Transmitted Disease °A sexually transmitted disease (STD) is a disease or infection that may be passed (transmitted) from person to person, usually during sexual activity. This may happen by way of saliva, semen, blood, vaginal mucus, or urine. Common STDs include: °· Gonorrhea. °· Chlamydia. °· Syphilis. °· HIV and AIDS. °· Genital herpes. °· Hepatitis B and C. °· Trichomonas. °· Human papillomavirus (HPV). °· Pubic lice. °· Scabies. °· Mites. °· Bacterial vaginosis. °WHAT ARE CAUSES OF STDs? °An STD may be caused by bacteria, a virus, or parasites. STDs are often transmitted during sexual activity if one person is infected. However, they may also be transmitted through nonsexual means. STDs may be transmitted after:  °· Sexual intercourse with an infected person. °· Sharing sex toys with an infected person. °· Sharing needles with an infected person or using unclean piercing or tattoo needles. °· Having intimate contact with the genitals, mouth, or rectal areas of an infected person. °· Exposure to infected fluids during birth. °WHAT ARE THE SIGNS AND SYMPTOMS OF STDs? °Different STDs have different symptoms. Some people may not have any symptoms. If symptoms are present, they may include: °· Painful or bloody urination. °· Pain in the pelvis, abdomen, vagina, anus, throat, or eyes. °· A skin rash, itching, or irritation. °· Growths, ulcerations, blisters, or sores in the genital and anal areas. °· Abnormal vaginal discharge with or without bad odor. °· Penile discharge in men. °· Fever. °· Pain or bleeding during sexual intercourse. °· Swollen glands in the groin area. °· Yellow skin and eyes (jaundice). This is seen with hepatitis. °· Swollen testicles. °· Infertility. °· Sores and blisters in the mouth. °HOW ARE STDs DIAGNOSED? °To make a diagnosis, your health care provider may: °· Take a medical history. °· Perform a physical exam. °· Take a sample of any discharge to examine. °· Swab the throat,  cervix, opening to the penis, rectum, or vagina for testing. °· Test a sample of your first morning urine. °· Perform blood tests. °· Perform a Pap test, if this applies. °· Perform a colposcopy. °· Perform a laparoscopy. °HOW ARE STDs TREATED? °Treatment depends on the STD. Some STDs may be treated but not cured. °· Chlamydia, gonorrhea, trichomonas, and syphilis can be cured with antibiotic medicine. °· Genital herpes, hepatitis, and HIV can be treated, but not cured, with prescribed medicines. The medicines lessen symptoms. °· Genital warts from HPV can be treated with medicine or by freezing, burning (electrocautery), or surgery. Warts may come back. °· HPV cannot be cured with medicine or surgery. However, abnormal areas may be removed from the cervix, vagina, or vulva. °· If your diagnosis is confirmed, your recent sexual partners need treatment. This is true even if they are symptom-free or have a negative culture or evaluation. They should not have sex until their health care providers say it is okay. °· Your health care provider may test you for infection again 3 months after treatment. °HOW CAN I REDUCE MY RISK OF GETTING AN STD? °Take these steps to reduce your risk of getting an STD: °· Use latex condoms, dental dams, and water-soluble lubricants during sexual activity. Do not use petroleum jelly or oils. °· Avoid having multiple sex partners. °· Do not have sex with someone who has other sex partners °· Do not have sex with anyone you do not know or who is at high risk for an STD. °· Avoid risky sex practices that can break your skin. °· Do not have sex   if you have open sores on your mouth or skin.  Avoid drinking too much alcohol or taking illegal drugs. Alcohol and drugs can affect your judgment and put you in a vulnerable position.  Avoid engaging in oral and anal sex acts.  Get vaccinated for HPV and hepatitis. If you have not received these vaccines in the past, talk to your health care  provider about whether one or both might be right for you.  If you are at risk of being infected with HIV, it is recommended that you take a prescription medicine daily to prevent HIV infection. This is called pre-exposure prophylaxis (PrEP). You are considered at risk if:  You are a man who has sex with other men (MSM).  You are a heterosexual man or woman and are sexually active with more than one partner.  You take drugs by injection.  You are sexually active with a partner who has HIV.  Talk with your health care provider about whether you are at high risk of being infected with HIV. If you choose to begin PrEP, you should first be tested for HIV. You should then be tested every 3 months for as long as you are taking PrEP. WHAT SHOULD I DO IF I THINK I HAVE AN STD?  See your health care provider.  Tell your sexual partner(s). They should be tested and treated for any STDs.  Do not have sex until your health care provider says it is okay. WHEN SHOULD I GET IMMEDIATE MEDICAL CARE? Contact your health care provider right away if:   You have severe abdominal pain.  You are a man and notice swelling or pain in your testicles.  You are a woman and notice swelling or pain in your vagina.   This information is not intended to replace advice given to you by your health care provider. Make sure you discuss any questions you have with your health care provider.   Document Released: 01/05/2003 Document Revised: 11/05/2014 Document Reviewed: 05/05/2013 Elsevier Interactive Patient Education 2016 [employer]   Take antibiotic as directed. Follow-up with the health department or a dermatologist if not improving. Return to the emergency room for any concerns.

## 2016-04-30 NOTE — ED Provider Notes (Signed)
[facility] Emergency Department Provider Note  ____________________________________________  Time seen: Approximately 8:32 PM  I have reviewed the triage vital signs and the nursing notes.   HISTORY  Chief Complaint Rash    HPI Latina CraverHenry L Litts is a 45 y.o. male who presents with a persistent rash to the lower extremities mostly in the thighs and groin this for 1-2 months. He is a patient of Dr. Omelia BlackwaterHeaden for bipolar and schizophrenia. He was given a cream but cannot remember the name, and it has not helped. He does have a history of unprotected sex. He is concerned about STDs. No dysuria or penile discharge. No testicular pain, fevers or chills. No abdominal pain or nausea.    Past Medical History  Diagnosis Date  . Anxiety   . Schizophrenia (HCC)   . Bipolar 1 disorder (HCC)   . Depression   . Diabetes mellitus without complication (HCC)   . Asthma   . Homelessness     There are no active problems to display for this patient.   History reviewed. No pertinent past surgical history.  Current Outpatient Rx  Name  Route  Sig  Dispense  Refill  . buPROPion (WELLBUTRIN XL) 300 MG 24 hr tablet   Oral   Take 300 mg by mouth daily.         . cefixime (SUPRAX) 400 MG tablet   Oral   Take 1 tablet (400 mg total) by mouth 2 (two) times daily.   14 tablet   0   . cephALEXin (KEFLEX) 500 MG capsule   Oral   Take 1 capsule (500 mg total) by mouth 4 (four) times daily.   20 capsule   0   . clotrimazole (CLOTRIMAZOLE AF) 1 % cream   Topical   Apply 1 application topically 2 (two) times daily.   30 g   0   . diazepam (VALIUM) 5 MG tablet   Oral   Take 1 tablet (5 mg total) by mouth 2 (two) times daily.   10 tablet   0   . HYDROcodone-acetaminophen (NORCO/VICODIN) 5-325 MG per tablet      Take 1-2 tablets by mouth every 6 hours as needed for pain and/or cough.   7 tablet   0   . meloxicam (MOBIC) 15 MG tablet   Oral   Take 1 tablet (15  mg total) by mouth daily. Patient not taking: Reported on 01/07/2015   20 tablet   0   . Miconazole Nitrate 2 % AERP      As directed in instructions   113 g   0   . naproxen (NAPROSYN) 500 MG tablet   Oral   Take 1 tablet (500 mg total) by mouth 2 (two) times daily.   30 tablet   0   . omega-3 acid ethyl esters (LOVAZA) 1 G capsule   Oral   Take 1 g by mouth daily.         . paliperidone (INVEGA) 9 MG 24 hr tablet   Oral   Take 9 mg by mouth every morning.           Allergies Review of patient's allergies indicates no known allergies.  No family history on file.  Social History Social History  Substance Use Topics  . Smoking status: Current Every Day Smoker -- 1.50 packs/day    Types: Cigarettes  . Smokeless tobacco: Never Used  . Alcohol Use: Yes     Comment: occas  Review of Systems Constitutional: No fever/chills Eyes: No visual changes. ENT: No sore throat. Cardiovascular: Denies chest pain. Respiratory: Denies shortness of breath. Gastrointestinal: No abdominal pain.  No nausea, no vomiting.  No diarrhea.  No constipation. Genitourinary: Negative for dysuria. Musculoskeletal: Negative for back pain. Skin: per HPI Neurological: Negative for headaches, focal weakness or numbness. 10-point ROS otherwise negative.  ____________________________________________   PHYSICAL EXAM:  VITAL SIGNS: ED Triage Vitals  Enc Vitals Group     BP 04/30/16 1936 127/77 mmHg     Pulse Rate 04/30/16 1936 87     Resp 04/30/16 1936 18     Temp 04/30/16 1936 98.7 F (37.1 C)     Temp Source 04/30/16 1936 Oral     SpO2 04/30/16 1936 100 %     Weight 04/30/16 1936 139 lb (63.05 kg)     Height 04/30/16 1936 5\' 6"  (1.676 m)     Head Cir --      Peak Flow --      Pain Score 04/30/16 1937 7     Pain Loc --      Pain Edu? --      Excl. in GC? --     Constitutional: Alert and oriented. Well appearing and in no acute distress. Eyes: Conjunctivae are normal.  PERRL. EOMI. Ears:  Clear with normal landmarks. No erythema. Head: Atraumatic. Nose: No congestion/rhinnorhea. Mouth/Throat: Mucous membranes are moist.  Oropharynx non-erythematous. No lesions. Neck:  Supple.  No adenopathy.    Cardiovascular: Normal rate, regular rhythm. Grossly normal heart sounds.  Good peripheral circulation. Respiratory: Normal respiratory effort.  No retractions. Lungs CTAB. Gastrointestinal: Soft and nontender. No distention. No abdominal bruits. No CVA tenderness. Musculoskeletal: Nml ROM of upper and lower extremity joints. Neurologic:  Normal speech and language. No gross focal neurologic deficits are appreciated. No gait instability. Skin:  Skin is warm, dry and intact. Papular rash to the thighs with some scaling, but not well demarcated. She papular lesions posterior upper arms as well. Psychiatric: Mood and affect are normal. Speech and behavior are normal.  ____________________________________________   LABS (all labs ordered are listed, but only abnormal results are displayed)  Labs Reviewed  CHLAMYDIA/NGC RT PCR ([facility] ONLY)   ____________________________________________  EKG   ____________________________________________  RADIOLOGY   ____________________________________________   PROCEDURES  Procedure(s) performed: None  Critical Care performed: No  ____________________________________________   INITIAL IMPRESSION / ASSESSMENT AND PLAN / ED COURSE  Pertinent labs & imaging results that were available during my care of the patient were reviewed by me and considered in my medical decision making (see chart for details).  45 year old with STD exposure and persistent rash. Cover for a gonorrhea folliculitis. Recommended Rocephin but patient refuses. Cannot tolerate needles because of his psychiatric history. Given Suprax for 1 week. Encouraged follow-up with the health department for further testing of STDs. He declines  testing today. Other than a urine test for GC chlamydia. ____________________________________________   FINAL CLINICAL IMPRESSION(S) / ED DIAGNOSES  Final diagnoses:  STD exposure  Rash      Ignacia BayleyRobert [place], PA-C 04/30/16 2035  Jeanmarie PlantJames A McShane, MD 04/30/16 [phone]

## 2016-07-02 DIAGNOSIS — Y939 Activity, unspecified: Secondary | ICD-10-CM | POA: Insufficient documentation

## 2016-07-02 DIAGNOSIS — S8392XA Sprain of unspecified site of left knee, initial encounter: Secondary | ICD-10-CM | POA: Insufficient documentation

## 2016-07-02 DIAGNOSIS — S20219A Contusion of unspecified front wall of thorax, initial encounter: Secondary | ICD-10-CM | POA: Insufficient documentation

## 2016-07-02 DIAGNOSIS — F17210 Nicotine dependence, cigarettes, uncomplicated: Secondary | ICD-10-CM | POA: Diagnosis not present

## 2016-07-02 DIAGNOSIS — J45909 Unspecified asthma, uncomplicated: Secondary | ICD-10-CM | POA: Diagnosis not present

## 2016-07-02 DIAGNOSIS — Z79899 Other long term (current) drug therapy: Secondary | ICD-10-CM | POA: Diagnosis not present

## 2016-07-02 DIAGNOSIS — S8992XA Unspecified injury of left lower leg, initial encounter: Secondary | ICD-10-CM | POA: Diagnosis present

## 2016-07-02 DIAGNOSIS — S0083XA Contusion of other part of head, initial encounter: Secondary | ICD-10-CM | POA: Insufficient documentation

## 2016-07-02 DIAGNOSIS — Y92410 Unspecified street and highway as the place of occurrence of the external cause: Secondary | ICD-10-CM | POA: Insufficient documentation

## 2016-07-02 DIAGNOSIS — E119 Type 2 diabetes mellitus without complications: Secondary | ICD-10-CM | POA: Diagnosis not present

## 2016-07-02 DIAGNOSIS — Y999 Unspecified external cause status: Secondary | ICD-10-CM | POA: Insufficient documentation

## 2016-07-03 ENCOUNTER — Encounter (HOSPITAL_COMMUNITY): Payer: Self-pay

## 2016-07-03 ENCOUNTER — Emergency Department (HOSPITAL_COMMUNITY): Payer: No Typology Code available for payment source

## 2016-07-03 ENCOUNTER — Emergency Department (HOSPITAL_COMMUNITY)
Admission: EM | Admit: 2016-07-03 | Discharge: 2016-07-03 | Disposition: A | Discharge status: Home / Self Care | Payer: No Typology Code available for payment source | Attending: Emergency Medicine | Admitting: Emergency Medicine

## 2016-07-03 DIAGNOSIS — S0083XA Contusion of other part of head, initial encounter: Secondary | ICD-10-CM

## 2016-07-03 DIAGNOSIS — S8392XA Sprain of unspecified site of left knee, initial encounter: Secondary | ICD-10-CM

## 2016-07-03 DIAGNOSIS — S20219A Contusion of unspecified front wall of thorax, initial encounter: Secondary | ICD-10-CM

## 2016-07-03 LAB — BASIC METABOLIC PANEL
Anion gap: 5 (ref 5–15)
BUN: 9 mg/dL (ref 6–20)
CHLORIDE: 104 mmol/L (ref 101–111)
CO2: 29 mmol/L (ref 22–32)
CREATININE: 0.93 mg/dL (ref 0.61–1.24)
Calcium: 9.6 mg/dL (ref 8.9–10.3)
GFR calc non Af Amer: >60 mL/min (ref >60)
Glucose, Bld: 98 mg/dL (ref 65–99)
POTASSIUM: 3.8 mmol/L (ref 3.5–5.1)
Sodium: 138 mmol/L (ref 135–145)

## 2016-07-03 LAB — CBC
HEMATOCRIT: 41.9 % (ref 39.0–52.0)
Hemoglobin: 13.4 g/dL (ref 13.0–17.0)
MCH: 23.6 pg — AB (ref 26.0–34.0)
MCHC: 32 g/dL (ref 30.0–36.0)
MCV: 73.9 fL — AB (ref 78.0–100.0)
PLATELETS: 231 10*3/uL (ref 150–400)
RBC: 5.67 MIL/uL (ref 4.22–5.81)
RDW: 14.7 % (ref 11.5–15.5)
WBC: 7.8 10*3/uL (ref 4.0–10.5)

## 2016-07-03 LAB — I-STAT TROPONIN, ED: Troponin i, poc: 0 ng/mL (ref 0.00–0.08)

## 2016-07-03 MED ORDER — TRAMADOL HCL 50 MG PO TABS: 50.0000 mg | ORAL_TABLET | Freq: Four times a day (QID) | ORAL | 0 refills | Status: DC | PRN

## 2016-07-03 MED ORDER — CYCLOBENZAPRINE HCL 10 MG PO TABS: 10.0000 mg | ORAL_TABLET | Freq: Two times a day (BID) | ORAL | 0 refills | Status: DC | PRN

## 2016-07-03 MED ORDER — IBUPROFEN 800 MG PO TABS: 800.0000 mg | ORAL_TABLET | Freq: Three times a day (TID) | ORAL | 0 refills | Status: DC
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
## 2016-08-05 ENCOUNTER — Encounter (HOSPITAL_COMMUNITY): Payer: Self-pay

## 2016-08-05 ENCOUNTER — Emergency Department (HOSPITAL_COMMUNITY): Payer: No Typology Code available for payment source

## 2016-08-05 ENCOUNTER — Emergency Department (HOSPITAL_COMMUNITY)
Admission: EM | Admit: 2016-08-05 | Discharge: 2016-08-05 | Disposition: A | Discharge status: Home / Self Care | Payer: No Typology Code available for payment source | Attending: Emergency Medicine | Admitting: Emergency Medicine

## 2016-08-05 DIAGNOSIS — Y939 Activity, unspecified: Secondary | ICD-10-CM | POA: Insufficient documentation

## 2016-08-05 DIAGNOSIS — S199XXA Unspecified injury of neck, initial encounter: Secondary | ICD-10-CM | POA: Diagnosis present

## 2016-08-05 DIAGNOSIS — J45909 Unspecified asthma, uncomplicated: Secondary | ICD-10-CM | POA: Diagnosis not present

## 2016-08-05 DIAGNOSIS — F17210 Nicotine dependence, cigarettes, uncomplicated: Secondary | ICD-10-CM | POA: Diagnosis not present

## 2016-08-05 DIAGNOSIS — Y92410 Unspecified street and highway as the place of occurrence of the external cause: Secondary | ICD-10-CM | POA: Insufficient documentation

## 2016-08-05 DIAGNOSIS — R079 Chest pain, unspecified: Secondary | ICD-10-CM | POA: Insufficient documentation

## 2016-08-05 DIAGNOSIS — Y999 Unspecified external cause status: Secondary | ICD-10-CM | POA: Insufficient documentation

## 2016-08-05 DIAGNOSIS — E119 Type 2 diabetes mellitus without complications: Secondary | ICD-10-CM | POA: Insufficient documentation

## 2016-08-05 DIAGNOSIS — S161XXA Strain of muscle, fascia and tendon at neck level, initial encounter: Secondary | ICD-10-CM | POA: Insufficient documentation

## 2016-08-05 DIAGNOSIS — S20219A Contusion of unspecified front wall of thorax, initial encounter: Secondary | ICD-10-CM | POA: Insufficient documentation

## 2016-08-05 MED ORDER — HYDROCODONE-ACETAMINOPHEN 5-325 MG PO TABS: 1.0000 | ORAL_TABLET | Freq: Four times a day (QID) | ORAL | 0 refills | Status: DC | PRN

## 2016-08-05 MED ORDER — LIDOCAINE HCL (PF) 1 % IJ SOLN: 5.0000 mL | Freq: Once | INTRAMUSCULAR | Status: DC

## 2016-08-05 MED ORDER — CYCLOBENZAPRINE HCL 10 MG PO TABS: 10.0000 mg | ORAL_TABLET | Freq: Two times a day (BID) | ORAL | 0 refills | Status: DC | PRN

## 2016-08-05 MED ORDER — NAPROXEN 500 MG PO TABS: 500.0000 mg | ORAL_TABLET | Freq: Two times a day (BID) | ORAL | 0 refills | Status: DC

## 2016-08-05 MED ORDER — CYCLOBENZAPRINE HCL 10 MG PO TABS
10.0000 mg | ORAL_TABLET | Freq: Once | ORAL | Status: AC
  Administered 2016-08-05: 10 mg via ORAL
  Filled 2016-08-05: qty 1

## 2016-08-05 MED ORDER — HYDROCODONE-ACETAMINOPHEN 5-325 MG PO TABS
1.0000 | ORAL_TABLET | Freq: Once | ORAL | Status: AC
  Administered 2016-08-05: 1 via ORAL
  Filled 2016-08-05: qty 1
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
## 2016-08-05 NOTE — ED Notes (Signed)
See providers assessment.  
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
## 2017-03-22 ENCOUNTER — Emergency Department
Admission: EM | Admit: 2017-03-22 | Discharge: 2017-03-22 | Disposition: A | Discharge status: Home / Self Care | Payer: Medicare Other | Attending: Emergency Medicine | Admitting: Emergency Medicine

## 2017-03-22 ENCOUNTER — Emergency Department: Payer: Medicare Other

## 2017-03-22 ENCOUNTER — Encounter: Payer: Self-pay | Admitting: Emergency Medicine

## 2017-03-22 DIAGNOSIS — R0602 Shortness of breath: Secondary | ICD-10-CM | POA: Insufficient documentation

## 2017-03-22 DIAGNOSIS — Z791 Long term (current) use of non-steroidal anti-inflammatories (NSAID): Secondary | ICD-10-CM | POA: Insufficient documentation

## 2017-03-22 DIAGNOSIS — E119 Type 2 diabetes mellitus without complications: Secondary | ICD-10-CM | POA: Diagnosis not present

## 2017-03-22 DIAGNOSIS — Z202 Contact with and (suspected) exposure to infections with a predominantly sexual mode of transmission: Secondary | ICD-10-CM | POA: Diagnosis not present

## 2017-03-22 DIAGNOSIS — Z79899 Other long term (current) drug therapy: Secondary | ICD-10-CM | POA: Diagnosis not present

## 2017-03-22 DIAGNOSIS — F319 Bipolar disorder, unspecified: Secondary | ICD-10-CM | POA: Diagnosis not present

## 2017-03-22 DIAGNOSIS — F17210 Nicotine dependence, cigarettes, uncomplicated: Secondary | ICD-10-CM | POA: Diagnosis not present

## 2017-03-22 DIAGNOSIS — Z113 Encounter for screening for infections with a predominantly sexual mode of transmission: Secondary | ICD-10-CM | POA: Diagnosis present

## 2017-03-22 DIAGNOSIS — J45909 Unspecified asthma, uncomplicated: Secondary | ICD-10-CM | POA: Diagnosis not present

## 2017-03-22 LAB — CBC WITH DIFFERENTIAL/PLATELET
BASOS PCT: 1 %
Basophils Absolute: 0 10*3/uL (ref 0–0.1)
EOS ABS: 0.1 10*3/uL (ref 0–0.7)
Eosinophils Relative: 2 %
HCT: 42.4 % (ref 40.0–52.0)
HEMOGLOBIN: 13.6 g/dL (ref 13.0–18.0)
Lymphocytes Relative: 37 %
Lymphs Abs: 2.3 10*3/uL (ref 1.0–3.6)
MCH: 23.8 pg — ABNORMAL LOW (ref 26.0–34.0)
MCHC: 32 g/dL (ref 32.0–36.0)
MCV: 74.3 fL — ABNORMAL LOW (ref 80.0–100.0)
Monocytes Absolute: 0.4 10*3/uL (ref 0.2–1.0)
Monocytes Relative: 7 %
NEUTROS PCT: 53 %
Neutro Abs: 3.3 10*3/uL (ref 1.4–6.5)
Platelets: 194 10*3/uL (ref 150–440)
RBC: 5.7 MIL/uL (ref 4.40–5.90)
RDW: 14.8 % — ABNORMAL HIGH (ref 11.5–14.5)
WBC: 6.2 10*3/uL (ref 3.8–10.6)

## 2017-03-22 LAB — BASIC METABOLIC PANEL
Anion gap: 6 (ref 5–15)
BUN: 10 mg/dL (ref 6–20)
CHLORIDE: 102 mmol/L (ref 101–111)
CO2: 29 mmol/L (ref 22–32)
CREATININE: 1 mg/dL (ref 0.61–1.24)
Calcium: 9.4 mg/dL (ref 8.9–10.3)
GFR calc non Af Amer: >60 mL/min (ref >60)
Glucose, Bld: 104 mg/dL — ABNORMAL HIGH (ref 65–99)
POTASSIUM: 3.7 mmol/L (ref 3.5–5.1)
SODIUM: 137 mmol/L (ref 135–145)

## 2017-03-22 LAB — CHLAMYDIA/NGC RT PCR (ARMC ONLY)
CHLAMYDIA TR: NOT DETECTED
N gonorrhoeae: NOT DETECTED

## 2017-03-22 MED ORDER — IPRATROPIUM-ALBUTEROL 0.5-2.5 (3) MG/3ML IN SOLN
RESPIRATORY_TRACT | Status: AC
  Filled 2017-03-22: qty 3

## 2017-03-22 MED ORDER — IPRATROPIUM-ALBUTEROL 0.5-2.5 (3) MG/3ML IN SOLN
3.0000 mL | Freq: Once | RESPIRATORY_TRACT | Status: AC
  Administered 2017-03-22: 3 mL via RESPIRATORY_TRACT

## 2017-03-22 NOTE — ED Notes (Signed)
Pt presents with sob, n/v, fatigue x several days. He also wants std check. Denies penile discharge. Pt states that his sob, nausea, vomiting, has gotten a bit better over the last couple of days. Pt alert & oriented with NAD noted.

## 2017-03-22 NOTE — ED Provider Notes (Signed)
[facility] Emergency Department Provider Note   ____________________________________________    I have reviewed the triage vital signs and the nursing notes.   HISTORY  Chief Complaint Shortness of Breath and STD check     HPI Hunter Monroe is a 46 y.o. male who is here primarily because he reports he has "been sleeping around "and wants to be checked for STDs. He denies dysuria or penile discharge or rash. Addition he reports that he has felt mild shortness of breath occasionally, he denies chest pain. Currently he reports his breathing is normal. He does smoke cigarettes and has for as long as he can remember. No recent travel, no calf pain or swelling. No fevers or chills   Past Medical History:  Diagnosis Date  . Anxiety   . Asthma   . Bipolar 1 disorder (HCC)   . Depression   . Diabetes mellitus without complication (HCC)   . Homelessness   . Schizophrenia (HCC)     There are no active problems to display for this patient.   History reviewed. No pertinent surgical history.  Prior to Admission medications   Medication Sig Start Date End Date Taking? Authorizing Provider  buPROPion (WELLBUTRIN XL) 300 MG 24 hr tablet Take 300 mg by mouth daily.    [provider]  cefixime (SUPRAX) 400 MG tablet Take 1 tablet (400 mg total) by mouth 2 (two) times daily. 04/30/16   Ignacia Bayleyumey, Laketa Sandoz, PA-C  cephALEXin (KEFLEX) 500 MG capsule Take 1 capsule (500 mg total) by mouth 4 (four) times daily. 01/07/15   Pisciotta, Joni ReiningNicole, PA-C  clotrimazole (CLOTRIMAZOLE AF) 1 % cream Apply 1 application topically 2 (two) times daily. 04/30/16   Ignacia Bayleyumey, Brax Walen, PA-C  cyclobenzaprine (FLEXERIL) 10 MG tablet Take 1 tablet (10 mg total) by mouth 2 (two) times daily as needed for muscle spasms. 08/05/16   Janne NapoleonNeese, Hope M, NP  diazepam (VALIUM) 5 MG tablet Take 1 tablet (5 mg total) by mouth 2 (two) times daily. 03/14/16   Melton Krebsiley, Samantha Nicole, PA-C    HYDROcodone-acetaminophen (NORCO) 5-325 MG tablet Take 1 tablet by mouth every 6 (six) hours as needed. 08/05/16   Janne NapoleonNeese, Hope M, NP  ibuprofen (ADVIL,MOTRIN) 800 MG tablet Take 1 tablet (800 mg total) by mouth 3 (three) times daily. 07/03/16   Danelle Berryapia, Leisa, PA-C  Miconazole Nitrate 2 % AERP As directed in instructions 01/07/15   Pisciotta, Joni ReiningNicole, PA-C  naproxen (NAPROSYN) 500 MG tablet Take 1 tablet (500 mg total) by mouth 2 (two) times daily. 08/05/16   Janne NapoleonNeese, Hope M, NP  omega-3 acid ethyl esters (LOVAZA) 1 G capsule Take 1 g by mouth daily.    [provider]  paliperidone (INVEGA) 9 MG 24 hr tablet Take 9 mg by mouth every morning.    [provider]  traMADol (ULTRAM) 50 MG tablet Take 1 tablet (50 mg total) by mouth every 6 (six) hours as needed. 07/03/16   Danelle Berryapia, Leisa, PA-C     Allergies Patient has no known allergies.  History reviewed. No pertinent family history.  Social History Social History  Substance Use Topics  . Smoking status: Current Every Day Smoker    Packs/day: 1.50    Types: Cigarettes  . Smokeless tobacco: Never Used  . Alcohol use Yes     Comment: occas    Review of Systems  Constitutional: No fever/chills Eyes: No visual changes.  ENT: No sore throat. Cardiovascular: Denies chest pain. Respiratory: As above Gastrointestinal:  No abdominal pain.   Genitourinary: Negative for dysuria.No discharge Musculoskeletal: Negative for back pain. Skin: Negative for rash. Neurological: Negative for headaches   ____________________________________________   PHYSICAL EXAM:  VITAL SIGNS: ED Triage Vitals  Enc Vitals Group     BP 03/22/17 1809 (!) 150/80     Pulse Rate 03/22/17 1809 82     Resp 03/22/17 1809 16     Temp 03/22/17 1809 98.6 F (37 C)     Temp Source 03/22/17 1809 Oral     SpO2 03/22/17 1809 100 %     Weight 03/22/17 1810 70.8 kg (156 lb)     Height 03/22/17 1810 1.676 m (5\' 6" )     Head Circumference --      Peak Flow --       Pain Score --      Pain Loc --      Pain Edu? --      Excl. in GC? --     Constitutional: Alert and oriented. No acute distress. Pleasant and interactive Eyes: Conjunctivae are normal.  Head: Atraumatic. Nose: No congestion/rhinnorhea. Mouth/Throat: Mucous membranes are moist.   Neck:  Painless ROM Cardiovascular: Normal rate, regular rhythm. Grossly normal heart sounds.  Good peripheral circulation. Respiratory: Normal respiratory effort.  No retractions. Lungs CTAB. Gastrointestinal: Soft and nontender. No distention.  No CVA tenderness. Genitourinary: No penile discharge, 'cupcake ring' through penis head, no rash or scrotal swelling Musculoskeletal: No lower extremity tenderness nor edema.  Warm and well perfused Neurologic:  Normal speech and language. No gross focal neurologic deficits are appreciated.  Skin:  Skin is warm, dry and intact. No rash noted. Psychiatric: Mood and affect are normal. Speech and behavior are normal.  ____________________________________________   LABS (all labs ordered are listed, but only abnormal results are displayed)  Labs Reviewed  CBC WITH DIFFERENTIAL/PLATELET - Abnormal; Notable for the following:       Result Value   MCV 74.3 (*)    MCH 23.8 (*)    RDW 14.8 (*)    All other components within normal limits  BASIC METABOLIC PANEL - Abnormal; Notable for the following:    Glucose, Bld 104 (*)    All other components within normal limits  CHLAMYDIA/NGC RT PCR ([facility] ONLY)   ____________________________________________  EKG  ED ECG REPORT I, Jene Every, the attending physician, personally viewed and interpreted this ECG.  Date: 03/22/2017  Rate: 80 Rhythm: normal sinus rhythm QRS Axis: normal Intervals: normal ST/T Wave abnormalities: Nonspecific changes Conduction Disturbances: none   ____________________________________________  RADIOLOGY  Chest x-ray  unremarkable ____________________________________________   PROCEDURES  Procedure(s) performed: No    Critical Care performed: No ____________________________________________   INITIAL IMPRESSION / ASSESSMENT AND PLAN / ED COURSE  Pertinent labs & imaging results that were available during my care of the patient were reviewed by me and considered in my medical decision making (see chart for details).  Patient well-appearing in no acute distress, no visible increased work of breathing, lungs clear to auscultation. Patient treated with DuoNeb for suspected mild bronchospasm. No evidence of STDs, we will send GC chlamydia urine and have sent the patient to follow-up with health Department for further testing    ____________________________________________   FINAL CLINICAL IMPRESSION(S) / ED DIAGNOSES  Final diagnoses:  Possible exposure to STD      NEW MEDICATIONS STARTED DURING THIS VISIT:  Discharge Medication List as of 03/22/2017  7:18 PM       Note:  This document  was prepared using Conservation officer, historic buildings and may include unintentional dictation errors.    Jene Every, MD 03/22/17 2006

## 2017-03-22 NOTE — ED Notes (Signed)
Pt refused to answer any questions prior to discharge and refused VS retake. NAD at this time.

## 2017-03-22 NOTE — ED Notes (Signed)
Pt roomed in ED33 and denies SOB. Pt states he wants to know when his blood work results will come back so he can "find out what diseases he has". Pt informed that we only test for 2 std's and that he will need to follow up with the health dept for further testing.

## 2017-03-22 NOTE — ED Triage Notes (Addendum)
[facility]  No respiratory distress. Not labored, no tachypnea, no wheezing. No pain. No fevers or cough. Pt also states "i have been sleeping around and want to be checked for STD and HIV". Has not had known exposure to anything.  Also wants antibiotics for bumps on fingers.

## 2017-06-17 ENCOUNTER — Emergency Department
Admission: EM | Admit: 2017-06-17 | Discharge: 2017-06-17 | Disposition: A | Discharge status: Home / Self Care | Payer: Medicare Other | Attending: Emergency Medicine | Admitting: Emergency Medicine

## 2017-06-17 ENCOUNTER — Encounter: Payer: Self-pay | Admitting: Emergency Medicine

## 2017-06-17 DIAGNOSIS — E119 Type 2 diabetes mellitus without complications: Secondary | ICD-10-CM | POA: Insufficient documentation

## 2017-06-17 DIAGNOSIS — L259 Unspecified contact dermatitis, unspecified cause: Secondary | ICD-10-CM | POA: Insufficient documentation

## 2017-06-17 DIAGNOSIS — J45909 Unspecified asthma, uncomplicated: Secondary | ICD-10-CM | POA: Diagnosis not present

## 2017-06-17 DIAGNOSIS — Z79899 Other long term (current) drug therapy: Secondary | ICD-10-CM | POA: Diagnosis not present

## 2017-06-17 DIAGNOSIS — R21 Rash and other nonspecific skin eruption: Secondary | ICD-10-CM | POA: Diagnosis present

## 2017-06-17 DIAGNOSIS — F17210 Nicotine dependence, cigarettes, uncomplicated: Secondary | ICD-10-CM | POA: Diagnosis not present

## 2017-06-17 MED ORDER — PREDNISONE 50 MG PO TABS: ORAL_TABLET | ORAL | 0 refills | Status: DC

## 2017-06-17 MED ORDER — FAMOTIDINE 20 MG PO TABS
20.0000 mg | ORAL_TABLET | Freq: Once | ORAL | Status: AC
  Administered 2017-06-17: 20 mg via ORAL
  Filled 2017-06-17: qty 1

## 2017-06-17 MED ORDER — DIPHENHYDRAMINE HCL 25 MG PO CAPS: 25.0000 mg | ORAL_CAPSULE | ORAL | 0 refills | Status: DC | PRN

## 2017-06-17 MED ORDER — PREDNISONE 20 MG PO TABS
60.0000 mg | ORAL_TABLET | Freq: Once | ORAL | Status: AC
  Administered 2017-06-17: 60 mg via ORAL
  Filled 2017-06-17: qty 3

## 2017-06-17 MED ORDER — RANITIDINE HCL 150 MG PO TABS: 150.0000 mg | ORAL_TABLET | Freq: Two times a day (BID) | ORAL | 0 refills | Status: DC

## 2017-06-17 NOTE — ED Triage Notes (Signed)
Patient ambulatory to triage with steady gait, without difficulty or distress noted; pt reports using "shower-to-shower" and has intermittent itching rash to arms since

## 2017-06-17 NOTE — ED Provider Notes (Signed)
[facility] Emergency Department Provider Note  ____________________________________________  Time seen: Approximately 8:17 PM  I have reviewed the triage vital signs and the nursing notes.   HISTORY  Chief Complaint Rash    HPI Hunter Monroe is a 46 y.o. male that presents to the emergency department for rash over bilateral arms for one month. Patient states that when he uses a certain body wash, he gets an itchy rash on his arms. He has quit using the body wash.He has tried a cream without relief.No fever, shortness breath, chest pain, nausea, vomiting, abdominal pain.   Past Medical History:  Diagnosis Date  . Anxiety   . Asthma   . Bipolar 1 disorder (HCC)   . Depression   . Diabetes mellitus without complication (HCC)   . Homelessness   . Schizophrenia (HCC)     There are no active problems to display for this patient.   History reviewed. No pertinent surgical history.  Prior to Admission medications   Medication Sig Start Date End Date Taking? Authorizing Provider  buPROPion (WELLBUTRIN XL) 300 MG 24 hr tablet Take 300 mg by mouth daily.    [provider]  cefixime (SUPRAX) 400 MG tablet Take 1 tablet (400 mg total) by mouth 2 (two) times daily. 04/30/16   Ignacia Bayley, PA-C  cephALEXin (KEFLEX) 500 MG capsule Take 1 capsule (500 mg total) by mouth 4 (four) times daily. 01/07/15   Pisciotta, Joni Reining, PA-C  clotrimazole (CLOTRIMAZOLE AF) 1 % cream Apply 1 application topically 2 (two) times daily. 04/30/16   Ignacia Bayley, PA-C  cyclobenzaprine (FLEXERIL) 10 MG tablet Take 1 tablet (10 mg total) by mouth 2 (two) times daily as needed for muscle spasms. 08/05/16   Janne Napoleon, NP  diazepam (VALIUM) 5 MG tablet Take 1 tablet (5 mg total) by mouth 2 (two) times daily. 03/14/16   Melton Krebs, PA-C  diphenhydrAMINE (BENADRYL) 25 mg capsule Take 1 capsule (25 mg total) by mouth every 4 (four) hours as needed. 06/17/17 06/17/18   Enid Derry, PA-C  HYDROcodone-acetaminophen (NORCO) 5-325 MG tablet Take 1 tablet by mouth every 6 (six) hours as needed. 08/05/16   Janne Napoleon, NP  ibuprofen (ADVIL,MOTRIN) 800 MG tablet Take 1 tablet (800 mg total) by mouth 3 (three) times daily. 07/03/16   Danelle Berry, PA-C  Miconazole Nitrate 2 % AERP As directed in instructions 01/07/15   Pisciotta, Joni Reining, PA-C  naproxen (NAPROSYN) 500 MG tablet Take 1 tablet (500 mg total) by mouth 2 (two) times daily. 08/05/16   Janne Napoleon, NP  omega-3 acid ethyl esters (LOVAZA) 1 G capsule Take 1 g by mouth daily.    [provider]  paliperidone (INVEGA) 9 MG 24 hr tablet Take 9 mg by mouth every morning.    [provider]  predniSONE (DELTASONE) 50 MG tablet Take 1 tablet for 5 days 06/17/17   Enid Derry, PA-C  ranitidine (ZANTAC) 150 MG tablet Take 1 tablet (150 mg total) by mouth 2 (two) times daily. 06/17/17 06/17/18  Enid Derry, PA-C  traMADol (ULTRAM) 50 MG tablet Take 1 tablet (50 mg total) by mouth every 6 (six) hours as needed. 07/03/16   Danelle Berry, PA-C    Allergies Patient has no known allergies.  No family history on file.  Social History Social History  Substance Use Topics  . Smoking status: Current Every Day Smoker    Packs/day: 1.50    Types: Cigarettes  . Smokeless tobacco: Never  Used  . Alcohol use Yes     Comment: occas     Review of Systems  Constitutional: No fever/chills ENT: No upper respiratory complaints. Respiratory: No SOB. Gastrointestinal: No abdominal pain.  No nausea, no vomiting.  Musculoskeletal: Negative for musculoskeletal pain. Skin: Negative for abrasions, lacerations, ecchymosis. Positive for rash. Neurological: Negative for numbness or tingling   ____________________________________________   PHYSICAL EXAM:  VITAL SIGNS: ED Triage Vitals  Enc Vitals Group     BP 06/17/17 1935 124/73     Pulse Rate 06/17/17 1935 82     Resp 06/17/17 1935 20     Temp  06/17/17 1935 97.8 F (36.6 C)     Temp Source 06/17/17 1935 Oral     SpO2 06/17/17 1935 100 %     Weight 06/17/17 1934 150 lb (68 kg)     Height 06/17/17 1934 5\' 6"  (1.676 m)     Head Circumference --      Peak Flow --      Pain Score --      Pain Loc --      Pain Edu? --      Excl. in GC? --      Constitutional: Alert and oriented. Well appearing and in no acute distress. Eyes: Conjunctivae are normal. PERRL. EOMI. Head: Atraumatic. ENT:      Ears:      Nose: No congestion/rhinnorhea.      Mouth/Throat: Mucous membranes are moist.  Neck: No stridor.   Cardiovascular: Normal rate, regular rhythm.  Good peripheral circulation. Respiratory: Normal respiratory effort without tachypnea or retractions. Lungs CTAB. Good air entry to the bases with no decreased or absent breath sounds. Musculoskeletal: Full range of motion to all extremities. No gross deformities appreciated. Neurologic:  Normal speech and language. No gross focal neurologic deficits are appreciated.  Skin:  Skin is warm, dry and intact. Excoriations and papules to upper arms bilaterally. No surrounding erythema. No drainage.  ____________________________________________   LABS (all labs ordered are listed, but only abnormal results are displayed)  Labs Reviewed - No data to display ____________________________________________  EKG   ____________________________________________  RADIOLOGY  No results found.  ____________________________________________    PROCEDURES  Procedure(s) performed:    Procedures    Medications  predniSONE (DELTASONE) tablet 60 mg (60 mg Oral Given 06/17/17 2031)  famotidine (PEPCID) tablet 20 mg (20 mg Oral Given 06/17/17 2031)     ____________________________________________   INITIAL IMPRESSION / ASSESSMENT AND PLAN / ED COURSE  Pertinent labs & imaging results that were available during my care of the patient were reviewed by me and considered in my medical  decision making (see chart for details).  Review of the [place] CSRS was performed in accordance of the [facility] prior to dispensing any controlled drugs.  Patient presented to the emergency department for evaluation of a rash. Rash consistent with contact dermatitis. Vital signs and exam are reassuring. Patient does not want any injections. He is driving so I will not give him any Benadryl here. Patient will be discharged home with prescriptions for prednisone, Benadryl, ranitidine. Patient is to follow up with PCP as directed. Patient is given ED precautions to return to the ED for any worsening or new symptoms.  ____________________________________________  FINAL CLINICAL IMPRESSION(S) / ED DIAGNOSES  Final diagnoses:  Contact dermatitis, unspecified contact dermatitis type, unspecified trigger      NEW MEDICATIONS STARTED DURING THIS VISIT:  Discharge Medication List as of 06/17/2017  8:44 PM  START taking these medications   Details  diphenhydrAMINE (BENADRYL) 25 mg capsule Take 1 capsule (25 mg total) by mouth every 4 (four) hours as needed., Starting Mon 06/17/2017, Until Tue 06/17/2018, Print    predniSONE (DELTASONE) 50 MG tablet Take 1 tablet for 5 days, Print    ranitidine (ZANTAC) 150 MG tablet Take 1 tablet (150 mg total) by mouth 2 (two) times daily., Starting Mon 06/17/2017, Until Tue 06/17/2018, Print            This chart was dictated using voice recognition software/Dragon. Despite best efforts to proofread, errors can occur which can change the meaning. Any change was purely unintentional.    Enid Derry, PA-C 06/17/17 2338    Pershing Proud Myra Rude, MD 06/18/17 [phone]

## 2017-09-02 ENCOUNTER — Emergency Department (HOSPITAL_COMMUNITY)
Admission: EM | Admit: 2017-09-02 | Discharge: 2017-09-02 | Discharge status: Against Medical Advice | Payer: No Typology Code available for payment source

## 2017-09-02 NOTE — ED Notes (Signed)
Called for triage x 2 without answer 

## 2017-09-02 NOTE — ED Notes (Signed)
Called for triage x 3 without answer

## 2017-09-03 ENCOUNTER — Encounter (HOSPITAL_COMMUNITY): Payer: Self-pay | Admitting: Emergency Medicine

## 2017-09-03 ENCOUNTER — Emergency Department (HOSPITAL_COMMUNITY)
Admission: EM | Admit: 2017-09-03 | Discharge: 2017-09-03 | Disposition: A | Discharge status: Home / Self Care | Payer: No Typology Code available for payment source | Attending: Emergency Medicine | Admitting: Emergency Medicine

## 2017-09-03 DIAGNOSIS — Y939 Activity, unspecified: Secondary | ICD-10-CM | POA: Diagnosis not present

## 2017-09-03 DIAGNOSIS — M545 Low back pain, unspecified: Secondary | ICD-10-CM

## 2017-09-03 DIAGNOSIS — Z79899 Other long term (current) drug therapy: Secondary | ICD-10-CM | POA: Insufficient documentation

## 2017-09-03 DIAGNOSIS — J45909 Unspecified asthma, uncomplicated: Secondary | ICD-10-CM | POA: Insufficient documentation

## 2017-09-03 DIAGNOSIS — F17210 Nicotine dependence, cigarettes, uncomplicated: Secondary | ICD-10-CM | POA: Insufficient documentation

## 2017-09-03 DIAGNOSIS — Y929 Unspecified place or not applicable: Secondary | ICD-10-CM | POA: Insufficient documentation

## 2017-09-03 DIAGNOSIS — E119 Type 2 diabetes mellitus without complications: Secondary | ICD-10-CM | POA: Insufficient documentation

## 2017-09-03 DIAGNOSIS — Y999 Unspecified external cause status: Secondary | ICD-10-CM | POA: Diagnosis not present

## 2017-09-03 MED ORDER — CARISOPRODOL 350 MG PO TABS
350.0000 mg | ORAL_TABLET | Freq: Three times a day (TID) | ORAL | 0 refills | Status: AC
Start: 2017-09-03 — End: 2017-09-08
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
## 2017-09-03 NOTE — Discharge Instructions (Signed)
Your pain is likely from sore muscles.  Take ibuprofen 600 mg + tylenol 1000 mg every 8 hours. Additionally, take soma as prescribed, this is for muscle spasms and tightness.   Rest for 2 days. After two days, light exercises and massage will help with muscle tightness.   Return to ED for worsening back pain, bladder/bowel incontinence or retention, numbness or weakness in your legs
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
## 2017-09-09 ENCOUNTER — Other Ambulatory Visit: Payer: Self-pay

## 2017-09-09 ENCOUNTER — Emergency Department (HOSPITAL_COMMUNITY): Payer: No Typology Code available for payment source

## 2017-09-09 ENCOUNTER — Emergency Department (HOSPITAL_COMMUNITY)
Admission: EM | Admit: 2017-09-09 | Discharge: 2017-09-09 | Disposition: A | Discharge status: Home / Self Care | Payer: No Typology Code available for payment source | Attending: Emergency Medicine | Admitting: Emergency Medicine

## 2017-09-09 ENCOUNTER — Encounter (HOSPITAL_COMMUNITY): Payer: Self-pay | Admitting: Emergency Medicine

## 2017-09-09 DIAGNOSIS — Z79899 Other long term (current) drug therapy: Secondary | ICD-10-CM | POA: Diagnosis not present

## 2017-09-09 DIAGNOSIS — E119 Type 2 diabetes mellitus without complications: Secondary | ICD-10-CM | POA: Insufficient documentation

## 2017-09-09 DIAGNOSIS — J45909 Unspecified asthma, uncomplicated: Secondary | ICD-10-CM | POA: Diagnosis not present

## 2017-09-09 DIAGNOSIS — F17210 Nicotine dependence, cigarettes, uncomplicated: Secondary | ICD-10-CM | POA: Insufficient documentation

## 2017-09-09 DIAGNOSIS — M25571 Pain in right ankle and joints of right foot: Secondary | ICD-10-CM

## 2017-09-09 DIAGNOSIS — M545 Low back pain, unspecified: Secondary | ICD-10-CM

## 2017-09-09 DIAGNOSIS — M25561 Pain in right knee: Secondary | ICD-10-CM

## 2017-09-09 MED ORDER — IBUPROFEN 600 MG PO TABS: 600.0000 mg | ORAL_TABLET | Freq: Four times a day (QID) | ORAL | 0 refills | Status: DC | PRN

## 2017-09-09 MED ORDER — IBUPROFEN 400 MG PO TABS
600.0000 mg | ORAL_TABLET | Freq: Once | ORAL | Status: AC
  Administered 2017-09-09: 600 mg via ORAL
  Filled 2017-09-09: qty 1

## 2017-09-09 NOTE — ED Notes (Signed)
Patient transported to X-ray 
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
## 2017-09-09 NOTE — Discharge Instructions (Signed)
Please use knee brace and crutches for comfort.  You may take Ibuprofen as needed for pain management. Do not combine with any pain reliever other than tylenol.  Your evaluation is reassuring, x-rays negative for fractures.  Please use the phone number provided to schedule an appointment with a primary care provider for follow-up.  You may also follow-up with orthopedics regarding your knee and ankle pain if it does not improve.  If you develop weakness, numbness in your lower extremities, difficulty walking, or loss of bowel or bladder control, or if other new or concerning symptoms develop please return to the ED for sooner evaluation

## 2018-01-22 ENCOUNTER — Emergency Department
Admission: EM | Admit: 2018-01-22 | Discharge: 2018-01-22 | Disposition: A | Discharge status: Home / Self Care | Payer: No Typology Code available for payment source | Attending: Emergency Medicine | Admitting: Emergency Medicine

## 2018-01-22 ENCOUNTER — Emergency Department (HOSPITAL_COMMUNITY)
Admission: EM | Admit: 2018-01-22 | Discharge: 2018-01-22 | Disposition: A | Discharge status: Home / Self Care | Payer: Medicare Other | Attending: Emergency Medicine | Admitting: Emergency Medicine

## 2018-01-22 ENCOUNTER — Other Ambulatory Visit: Payer: Self-pay

## 2018-01-22 ENCOUNTER — Encounter (HOSPITAL_COMMUNITY): Payer: Self-pay | Admitting: Emergency Medicine

## 2018-01-22 DIAGNOSIS — Z5321 Procedure and treatment not carried out due to patient leaving prior to being seen by health care provider: Secondary | ICD-10-CM | POA: Insufficient documentation

## 2018-01-22 DIAGNOSIS — M79602 Pain in left arm: Secondary | ICD-10-CM | POA: Insufficient documentation

## 2018-01-22 DIAGNOSIS — M545 Low back pain: Secondary | ICD-10-CM | POA: Diagnosis not present

## 2018-01-22 DIAGNOSIS — M79605 Pain in left leg: Secondary | ICD-10-CM | POA: Insufficient documentation
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
## 2018-01-22 NOTE — ED Notes (Signed)
Pt not seen in room at this time

## 2018-01-24 ENCOUNTER — Emergency Department
Admission: EM | Admit: 2018-01-24 | Discharge: 2018-01-24 | Disposition: A | Discharge status: Home / Self Care | Payer: Medicare Other | Attending: Emergency Medicine | Admitting: Emergency Medicine

## 2018-01-24 ENCOUNTER — Encounter: Payer: Self-pay | Admitting: Emergency Medicine

## 2018-01-24 ENCOUNTER — Emergency Department: Payer: Medicare Other

## 2018-01-24 DIAGNOSIS — M25551 Pain in right hip: Secondary | ICD-10-CM | POA: Diagnosis not present

## 2018-01-24 DIAGNOSIS — J45909 Unspecified asthma, uncomplicated: Secondary | ICD-10-CM | POA: Insufficient documentation

## 2018-01-24 DIAGNOSIS — F17210 Nicotine dependence, cigarettes, uncomplicated: Secondary | ICD-10-CM | POA: Diagnosis not present

## 2018-01-24 DIAGNOSIS — M79605 Pain in left leg: Secondary | ICD-10-CM | POA: Diagnosis not present

## 2018-01-24 DIAGNOSIS — M79632 Pain in left forearm: Secondary | ICD-10-CM | POA: Insufficient documentation

## 2018-01-24 DIAGNOSIS — Y92410 Unspecified street and highway as the place of occurrence of the external cause: Secondary | ICD-10-CM | POA: Diagnosis not present

## 2018-01-24 DIAGNOSIS — E119 Type 2 diabetes mellitus without complications: Secondary | ICD-10-CM | POA: Insufficient documentation

## 2018-01-24 DIAGNOSIS — Y999 Unspecified external cause status: Secondary | ICD-10-CM | POA: Insufficient documentation

## 2018-01-24 DIAGNOSIS — Z7984 Long term (current) use of oral hypoglycemic drugs: Secondary | ICD-10-CM | POA: Diagnosis not present

## 2018-01-24 DIAGNOSIS — Y9389 Activity, other specified: Secondary | ICD-10-CM | POA: Insufficient documentation

## 2018-01-24 DIAGNOSIS — S79911A Unspecified injury of right hip, initial encounter: Secondary | ICD-10-CM | POA: Diagnosis present

## 2018-01-24 DIAGNOSIS — Z79899 Other long term (current) drug therapy: Secondary | ICD-10-CM | POA: Insufficient documentation

## 2018-01-24 DIAGNOSIS — M7918 Myalgia, other site: Secondary | ICD-10-CM

## 2018-01-24 MED ORDER — CYCLOBENZAPRINE HCL 10 MG PO TABS: 10.0000 mg | ORAL_TABLET | Freq: Three times a day (TID) | ORAL | 0 refills | Status: DC | PRN

## 2018-01-24 MED ORDER — IBUPROFEN 600 MG PO TABS: 600.0000 mg | ORAL_TABLET | Freq: Three times a day (TID) | ORAL | 0 refills | Status: DC | PRN
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
## 2018-02-03 ENCOUNTER — Other Ambulatory Visit: Payer: Self-pay

## 2018-02-03 ENCOUNTER — Emergency Department (HOSPITAL_COMMUNITY)
Admission: EM | Admit: 2018-02-03 | Discharge: 2018-02-03 | Disposition: A | Discharge status: Home / Self Care | Payer: Medicare Other | Attending: Emergency Medicine | Admitting: Emergency Medicine

## 2018-02-03 ENCOUNTER — Encounter (HOSPITAL_COMMUNITY): Payer: Self-pay | Admitting: *Deleted

## 2018-02-03 DIAGNOSIS — J45909 Unspecified asthma, uncomplicated: Secondary | ICD-10-CM | POA: Insufficient documentation

## 2018-02-03 DIAGNOSIS — M545 Low back pain, unspecified: Secondary | ICD-10-CM

## 2018-02-03 DIAGNOSIS — E119 Type 2 diabetes mellitus without complications: Secondary | ICD-10-CM | POA: Diagnosis not present

## 2018-02-03 DIAGNOSIS — Z79899 Other long term (current) drug therapy: Secondary | ICD-10-CM | POA: Insufficient documentation

## 2018-02-03 DIAGNOSIS — F17210 Nicotine dependence, cigarettes, uncomplicated: Secondary | ICD-10-CM | POA: Diagnosis not present

## 2018-02-03 MED ORDER — CYCLOBENZAPRINE HCL 10 MG PO TABS: 10.0000 mg | ORAL_TABLET | Freq: Two times a day (BID) | ORAL | 0 refills | Status: DC | PRN

## 2018-02-03 MED ORDER — NAPROXEN 500 MG PO TABS: 500.0000 mg | ORAL_TABLET | Freq: Two times a day (BID) | ORAL | 0 refills | Status: DC
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
## 2018-02-08 ENCOUNTER — Other Ambulatory Visit: Payer: Self-pay

## 2018-02-08 ENCOUNTER — Emergency Department
Admission: EM | Admit: 2018-02-08 | Discharge: 2018-02-08 | Disposition: A | Discharge status: Home / Self Care | Payer: Medicare Other | Attending: Emergency Medicine | Admitting: Emergency Medicine

## 2018-02-08 ENCOUNTER — Emergency Department: Payer: Medicare Other

## 2018-02-08 DIAGNOSIS — J45909 Unspecified asthma, uncomplicated: Secondary | ICD-10-CM | POA: Insufficient documentation

## 2018-02-08 DIAGNOSIS — T679XXA Effect of heat and light, unspecified, initial encounter: Secondary | ICD-10-CM | POA: Diagnosis not present

## 2018-02-08 DIAGNOSIS — Z79899 Other long term (current) drug therapy: Secondary | ICD-10-CM | POA: Diagnosis not present

## 2018-02-08 DIAGNOSIS — R0789 Other chest pain: Secondary | ICD-10-CM

## 2018-02-08 DIAGNOSIS — R079 Chest pain, unspecified: Secondary | ICD-10-CM | POA: Diagnosis present

## 2018-02-08 DIAGNOSIS — F17210 Nicotine dependence, cigarettes, uncomplicated: Secondary | ICD-10-CM | POA: Insufficient documentation

## 2018-02-08 DIAGNOSIS — E119 Type 2 diabetes mellitus without complications: Secondary | ICD-10-CM | POA: Insufficient documentation

## 2018-02-08 DIAGNOSIS — T7500XA Unspecified effects of lightning, initial encounter: Secondary | ICD-10-CM

## 2018-02-08 LAB — CBC
HCT: 45.5 % (ref 40.0–52.0)
Hemoglobin: 14.8 g/dL (ref 13.0–18.0)
MCH: 23.9 pg — ABNORMAL LOW (ref 26.0–34.0)
MCHC: 32.5 g/dL (ref 32.0–36.0)
MCV: 73.4 fL — ABNORMAL LOW (ref 80.0–100.0)
PLATELETS: 190 10*3/uL (ref 150–440)
RBC: 6.19 MIL/uL — AB (ref 4.40–5.90)
RDW: 14.4 % (ref 11.5–14.5)
WBC: 8 10*3/uL (ref 3.8–10.6)

## 2018-02-08 LAB — BASIC METABOLIC PANEL
Anion gap: 4 — ABNORMAL LOW (ref 5–15)
BUN: 9 mg/dL (ref 6–20)
CO2: 27 mmol/L (ref 22–32)
CREATININE: 0.85 mg/dL (ref 0.61–1.24)
Calcium: 9.3 mg/dL (ref 8.9–10.3)
Chloride: 105 mmol/L (ref 101–111)
Glucose, Bld: 119 mg/dL — ABNORMAL HIGH (ref 65–99)
POTASSIUM: 3.6 mmol/L (ref 3.5–5.1)
SODIUM: 136 mmol/L (ref 135–145)

## 2018-02-08 LAB — TROPONIN I: Troponin I: <0.03 ng/mL (ref <0.03)

## 2018-02-08 LAB — CK: CK TOTAL: 166 U/L (ref 49–397)

## 2018-02-08 MED ORDER — HYDROCODONE-ACETAMINOPHEN 5-325 MG PO TABS: 1.0000 | ORAL_TABLET | Freq: Four times a day (QID) | ORAL | 0 refills | Status: DC | PRN

## 2018-02-08 MED ORDER — HYDROCODONE-ACETAMINOPHEN 5-325 MG PO TABS
1.0000 | ORAL_TABLET | Freq: Once | ORAL | Status: AC
  Administered 2018-02-08: 1 via ORAL
  Filled 2018-02-08: qty 1

## 2018-02-08 MED ORDER — IBUPROFEN 600 MG PO TABS
600.0000 mg | ORAL_TABLET | Freq: Once | ORAL | Status: AC
  Administered 2018-02-08: 600 mg via ORAL
  Filled 2018-02-08: qty 1
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
## 2018-02-11 ENCOUNTER — Emergency Department
Admission: EM | Admit: 2018-02-11 | Discharge: 2018-02-11 | Disposition: A | Discharge status: Home / Self Care | Payer: No Typology Code available for payment source | Attending: Emergency Medicine | Admitting: Emergency Medicine

## 2018-02-11 ENCOUNTER — Emergency Department: Payer: No Typology Code available for payment source

## 2018-02-11 ENCOUNTER — Encounter: Payer: Self-pay | Admitting: *Deleted

## 2018-02-11 ENCOUNTER — Other Ambulatory Visit: Payer: Self-pay

## 2018-02-11 DIAGNOSIS — Z79899 Other long term (current) drug therapy: Secondary | ICD-10-CM | POA: Insufficient documentation

## 2018-02-11 DIAGNOSIS — J45909 Unspecified asthma, uncomplicated: Secondary | ICD-10-CM | POA: Diagnosis not present

## 2018-02-11 DIAGNOSIS — M5412 Radiculopathy, cervical region: Secondary | ICD-10-CM | POA: Diagnosis not present

## 2018-02-11 DIAGNOSIS — F17210 Nicotine dependence, cigarettes, uncomplicated: Secondary | ICD-10-CM | POA: Diagnosis not present

## 2018-02-11 DIAGNOSIS — E119 Type 2 diabetes mellitus without complications: Secondary | ICD-10-CM | POA: Diagnosis not present

## 2018-02-11 DIAGNOSIS — M542 Cervicalgia: Secondary | ICD-10-CM | POA: Diagnosis present

## 2018-02-11 MED ORDER — PREDNISONE 50 MG PO TABS: ORAL_TABLET | ORAL | 0 refills | Status: DC

## 2018-02-11 MED ORDER — PREDNISONE 20 MG PO TABS
60.0000 mg | ORAL_TABLET | Freq: Once | ORAL | Status: AC
  Administered 2018-02-11: 60 mg via ORAL
  Filled 2018-02-11: qty 3
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
## 2018-02-23 ENCOUNTER — Emergency Department: Payer: Medicare Other

## 2018-02-23 ENCOUNTER — Emergency Department
Admission: EM | Admit: 2018-02-23 | Discharge: 2018-02-23 | Disposition: A | Discharge status: Home / Self Care | Payer: Medicare Other | Attending: Emergency Medicine | Admitting: Emergency Medicine

## 2018-02-23 ENCOUNTER — Other Ambulatory Visit: Payer: Self-pay

## 2018-02-23 DIAGNOSIS — Y939 Activity, unspecified: Secondary | ICD-10-CM | POA: Diagnosis not present

## 2018-02-23 DIAGNOSIS — Z5321 Procedure and treatment not carried out due to patient leaving prior to being seen by health care provider: Secondary | ICD-10-CM | POA: Diagnosis not present

## 2018-02-23 DIAGNOSIS — Y929 Unspecified place or not applicable: Secondary | ICD-10-CM | POA: Insufficient documentation

## 2018-02-23 DIAGNOSIS — Y998 Other external cause status: Secondary | ICD-10-CM | POA: Diagnosis not present

## 2018-02-23 DIAGNOSIS — R0789 Other chest pain: Secondary | ICD-10-CM | POA: Diagnosis present
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
## 2018-02-23 NOTE — ED Notes (Signed)
Called pt to take to treatment room. No Answer 

## 2018-02-23 NOTE — ED Notes (Signed)
Patient up to stat desk asking about wait time.  Explained process and reason for wait time.  Patient verbally aggressive with this RN and registration clerk.  Patient stating, "I'm not going to wait around here all night.  You can sit here and wait on God but I'm not going to."
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
## 2018-02-23 NOTE — ED Notes (Signed)
Called pt to be taken back to treatment room. No Answer

## 2018-02-23 NOTE — ED Notes (Signed)
Pt called to be taken to treatment room. No answer.

## 2018-02-24 ENCOUNTER — Other Ambulatory Visit: Payer: Self-pay

## 2018-02-24 ENCOUNTER — Emergency Department
Admission: EM | Admit: 2018-02-24 | Discharge: 2018-02-25 | Disposition: A | Discharge status: Home / Self Care | Payer: Medicare Other | Attending: Emergency Medicine | Admitting: Emergency Medicine

## 2018-02-24 ENCOUNTER — Encounter: Payer: Self-pay | Admitting: Emergency Medicine

## 2018-02-24 DIAGNOSIS — F17210 Nicotine dependence, cigarettes, uncomplicated: Secondary | ICD-10-CM | POA: Insufficient documentation

## 2018-02-24 DIAGNOSIS — S20211A Contusion of right front wall of thorax, initial encounter: Secondary | ICD-10-CM | POA: Insufficient documentation

## 2018-02-24 DIAGNOSIS — Y9289 Other specified places as the place of occurrence of the external cause: Secondary | ICD-10-CM | POA: Diagnosis not present

## 2018-02-24 DIAGNOSIS — J45909 Unspecified asthma, uncomplicated: Secondary | ICD-10-CM | POA: Insufficient documentation

## 2018-02-24 DIAGNOSIS — Y9389 Activity, other specified: Secondary | ICD-10-CM | POA: Insufficient documentation

## 2018-02-24 DIAGNOSIS — E119 Type 2 diabetes mellitus without complications: Secondary | ICD-10-CM | POA: Diagnosis not present

## 2018-02-24 DIAGNOSIS — Z79899 Other long term (current) drug therapy: Secondary | ICD-10-CM | POA: Insufficient documentation

## 2018-02-24 DIAGNOSIS — S299XXA Unspecified injury of thorax, initial encounter: Secondary | ICD-10-CM | POA: Diagnosis present

## 2018-02-24 DIAGNOSIS — Y999 Unspecified external cause status: Secondary | ICD-10-CM | POA: Diagnosis not present

## 2018-02-24 DIAGNOSIS — S20211D Contusion of right front wall of thorax, subsequent encounter: Secondary | ICD-10-CM
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
## 2018-02-25 ENCOUNTER — Other Ambulatory Visit: Payer: Self-pay

## 2018-02-25 MED ORDER — IBUPROFEN 600 MG PO TABS
600.0000 mg | ORAL_TABLET | Freq: Once | ORAL | Status: AC
  Administered 2018-02-25: 600 mg via ORAL
  Filled 2018-02-25: qty 1
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
## 2018-02-25 NOTE — Discharge Instructions (Signed)
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
## 2018-02-26 ENCOUNTER — Other Ambulatory Visit: Payer: Self-pay

## 2018-02-26 ENCOUNTER — Emergency Department (HOSPITAL_COMMUNITY)
Admission: EM | Admit: 2018-02-26 | Discharge: 2018-02-26 | Discharge status: Against Medical Advice | Payer: Medicare Other | Attending: Emergency Medicine | Admitting: Emergency Medicine

## 2018-02-26 ENCOUNTER — Encounter (HOSPITAL_COMMUNITY): Payer: Self-pay

## 2018-02-26 DIAGNOSIS — Z79899 Other long term (current) drug therapy: Secondary | ICD-10-CM | POA: Insufficient documentation

## 2018-02-26 DIAGNOSIS — F17210 Nicotine dependence, cigarettes, uncomplicated: Secondary | ICD-10-CM | POA: Insufficient documentation

## 2018-02-26 DIAGNOSIS — R0781 Pleurodynia: Secondary | ICD-10-CM | POA: Diagnosis present

## 2018-02-26 DIAGNOSIS — Z590 Homelessness: Secondary | ICD-10-CM | POA: Insufficient documentation

## 2018-02-26 DIAGNOSIS — R0602 Shortness of breath: Secondary | ICD-10-CM | POA: Insufficient documentation

## 2018-02-26 DIAGNOSIS — E119 Type 2 diabetes mellitus without complications: Secondary | ICD-10-CM | POA: Insufficient documentation

## 2018-02-26 DIAGNOSIS — J45909 Unspecified asthma, uncomplicated: Secondary | ICD-10-CM | POA: Diagnosis not present
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
## 2018-02-26 NOTE — ED Notes (Signed)
Patient not in room

## 2018-02-26 NOTE — Care Management (Signed)
ED CM went to room to meet with patient, but was informed patient walked out.
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
## 2018-03-16 ENCOUNTER — Encounter: Payer: Self-pay | Admitting: Emergency Medicine

## 2018-03-16 ENCOUNTER — Emergency Department
Admission: EM | Admit: 2018-03-16 | Discharge: 2018-03-16 | Disposition: A | Discharge status: Home / Self Care | Payer: Medicare Other | Attending: Emergency Medicine | Admitting: Emergency Medicine

## 2018-03-16 ENCOUNTER — Other Ambulatory Visit: Payer: Self-pay

## 2018-03-16 DIAGNOSIS — J45909 Unspecified asthma, uncomplicated: Secondary | ICD-10-CM | POA: Diagnosis not present

## 2018-03-16 DIAGNOSIS — F17210 Nicotine dependence, cigarettes, uncomplicated: Secondary | ICD-10-CM | POA: Diagnosis not present

## 2018-03-16 DIAGNOSIS — E119 Type 2 diabetes mellitus without complications: Secondary | ICD-10-CM | POA: Diagnosis not present

## 2018-03-16 DIAGNOSIS — R300 Dysuria: Secondary | ICD-10-CM | POA: Insufficient documentation

## 2018-03-16 DIAGNOSIS — Z79899 Other long term (current) drug therapy: Secondary | ICD-10-CM | POA: Insufficient documentation

## 2018-03-16 DIAGNOSIS — R21 Rash and other nonspecific skin eruption: Secondary | ICD-10-CM | POA: Diagnosis present

## 2018-03-16 DIAGNOSIS — L209 Atopic dermatitis, unspecified: Secondary | ICD-10-CM | POA: Insufficient documentation

## 2018-03-16 LAB — URINALYSIS, ROUTINE W REFLEX MICROSCOPIC
BACTERIA UA: NONE SEEN
BILIRUBIN URINE: NEGATIVE
Glucose, UA: NEGATIVE mg/dL
Ketones, ur: NEGATIVE mg/dL
Leukocytes, UA: NEGATIVE
NITRITE: NEGATIVE
PH: 6 (ref 5.0–8.0)
Protein, ur: NEGATIVE mg/dL
SPECIFIC GRAVITY, URINE: 1.005 (ref 1.005–1.030)

## 2018-03-16 LAB — CHLAMYDIA/NGC RT PCR (ARMC ONLY)
CHLAMYDIA TR: NOT DETECTED
N gonorrhoeae: NOT DETECTED

## 2018-03-16 MED ORDER — TRIAMCINOLONE ACETONIDE 0.1 % EX OINT: 1.0000 "application " | TOPICAL_OINTMENT | Freq: Two times a day (BID) | CUTANEOUS | 0 refills | Status: AC

## 2018-03-16 NOTE — ED Triage Notes (Signed)
Pt in via POV, asking for a medical exam, states, "I have been having unprotected sex."  Pt denies any urinary symptoms or penile discharge.  Pt reports rash to arms.  Vitals WDL, NAD noted at this time.

## 2018-03-16 NOTE — Discharge Instructions (Addendum)
Your urinalysis is not concerning for infection We will call you about your other urine test results I have prescribed you an ointment to use 2 x day until rash resolves

## 2018-03-16 NOTE — ED Provider Notes (Signed)
[facility] Emergency Department Provider Note ____________________________________________  Time seen: 1935  I have reviewed the triage vital signs and the nursing notes. He noticed the rash about 1 week ago. The rash itches. It has not spread. He denies changes in soaps, lotions or detergent. He has not come in contact with anything that he is allergic to. No one in his home has a similar rash. He has not taken anything OTC.  He is very concerned that he has this rash because he has been having unprotected sex with multiple women. He is hacing some dysuria, but denies urgency, frequency, lower abdominal pain or testicular pain. He denies penile discharge.  HISTORY  Chief Complaint  Exposure to STD   HPI Hunter Monroe is a 47 y.o. male who presents to the ER with c/o rash to bilateral forearms.   Past Medical History:  Diagnosis Date  . Anxiety   . Asthma   . Bipolar 1 disorder (HCC)   . Depression   . Diabetes mellitus without complication (HCC)   . Homelessness   . Schizophrenia (HCC)     There are no active problems to display for this patient.   History reviewed. No pertinent surgical history.  Prior to Admission medications   Medication Sig Start Date End Date Taking? Authorizing Provider  buPROPion (WELLBUTRIN XL) 300 MG 24 hr tablet Take 300 mg by mouth daily.    [provider]  cyclobenzaprine (FLEXERIL) 10 MG tablet Take 1 tablet (10 mg total) by mouth 2 (two) times daily as needed for muscle spasms. Patient not taking: Reported on 02/08/2018 02/03/18   Liberty Handy, PA-C  diphenhydrAMINE (BENADRYL) 25 mg capsule Take 1 capsule (25 mg total) by mouth every 4 (four) hours as needed. 06/17/17 06/17/18  Enid Derry, PA-C  HYDROcodone-acetaminophen (NORCO) 5-325 MG tablet Take 1 tablet by mouth every 6 (six) hours as needed for up to 5 doses for severe pain. 02/08/18   Merrily Brittle, MD  hydrOXYzine (ATARAX/VISTARIL) 50 MG tablet  Take 1 tablet by mouth 4 (four) times daily as needed. 12/17/17   [provider]  ibuprofen (ADVIL,MOTRIN) 600 MG tablet Take 1 tablet (600 mg total) every 6 (six) hours as needed by mouth. Patient taking differently: Take 600 mg by mouth every 6 (six) hours as needed for mild pain or moderate pain.  09/09/17   Dartha Lodge, PA-C  naproxen (NAPROSYN) 500 MG tablet Take 1 tablet (500 mg total) by mouth 2 (two) times daily. Patient not taking: Reported on 02/08/2018 02/03/18   Liberty Handy, PA-C  paliperidone (INVEGA) 9 MG 24 hr tablet Take 9 mg by mouth every morning.    [provider]  predniSONE (DELTASONE) 50 MG tablet Take one 50 mg tablet once daily for the next 5 days. 02/11/18   Orvil Feil, PA-C  QUEtiapine (SEROQUEL) 400 MG tablet Take 1 tablet by mouth at bedtime. 12/17/17   [provider]  traZODone (DESYREL) 100 MG tablet Take 200 mg by mouth at bedtime. 12/17/17   [provider]    Allergies Patient has no known allergies.  No family history on file.  Social History Social History   Tobacco Use  . Smoking status: Current Every Day Smoker    Packs/day: 0.25    Types: Cigarettes  . Smokeless tobacco: Never Used  Substance Use Topics  . Alcohol use: Not Currently  . Drug use: No    Review of Systems  Constitutional: Negative  for fever. Gastrointestinal: Negative for abdominal pain, vomiting and diarrhea. Genitourinary: Positive for dysuria.  Negative for urgency, frequency, discharge or testicular pain. Skin: Positive for rash.  ____________________________________________  PHYSICAL EXAM:  VITAL SIGNS: ED Triage Vitals  Enc Vitals Group     BP 03/16/18 1851 121/68     Pulse Rate 03/16/18 1851 73     Resp 03/16/18 1851 16     Temp 03/16/18 1851 98.4 F (36.9 C)     Temp Source 03/16/18 1851 Oral     SpO2 03/16/18 1851 99 %     Weight 03/16/18 1852 150 lb (68 kg)     Height 03/16/18 1852  (1.702 m)     Head  Circumference --      Peak Flow --      Pain Score 03/16/18 1852 4     Pain Loc --      Pain Edu? --      Excl. in GC? --     Constitutional: Alert and oriented. Well appearing and in no distress. Cardiovascular: Normal rate, regular rhythm. Respiratory: Normal respiratory effort. No wheezes/rales/rhonchi. Gastrointestinal: Soft and nontender.  Skin: Scattered maculopapular rash noted bilateral upper extremities.  ____________________________________________   LABS Urinalysis    Component Value Date/Time   COLORURINE STRAW (A) 03/16/2018 2033   APPEARANCEUR CLEAR (A) 03/16/2018 2033   LABSPEC 1.005 03/16/2018 2033   PHURINE 6.0 03/16/2018 2033   GLUCOSEU NEGATIVE 03/16/2018 2033   HGBUR SMALL (A) 03/16/2018 2033   BILIRUBINUR NEGATIVE 03/16/2018 2033   KETONESUR NEGATIVE 03/16/2018 2033   PROTEINUR NEGATIVE 03/16/2018 2033   NITRITE NEGATIVE 03/16/2018 2033   LEUKOCYTESUR NEGATIVE 03/16/2018 2033    GC and chlamydia DNA probe obtained and sent to lab.  ____________________________________________   INITIAL IMPRESSION / ASSESSMENT AND PLAN / ED COURSE  Rash:  Appears to be atopic dermatitis He is refusing Rx for Prednisone at this time Rx for Triamcinolone Ointment BID until rash resolved  Dysuria:  Urinalysis- not concerning for infection Urine GC pending Push fluids  ____________________________________________  FINAL CLINICAL IMPRESSION(S) / ED DIAGNOSES  Final diagnoses:  Rash  Dysuria  Atopic dermatitis, mild      [name], NP 03/16/18 2126    Phineas Semen, MD 03/16/18 2217

## 2018-03-24 ENCOUNTER — Other Ambulatory Visit: Payer: Self-pay

## 2018-03-24 ENCOUNTER — Emergency Department: Payer: Medicare Other

## 2018-03-24 ENCOUNTER — Encounter: Payer: Self-pay | Admitting: Emergency Medicine

## 2018-03-24 ENCOUNTER — Emergency Department
Admission: EM | Admit: 2018-03-24 | Discharge: 2018-03-24 | Disposition: A | Discharge status: Home / Self Care | Payer: Medicare Other | Attending: Emergency Medicine | Admitting: Emergency Medicine

## 2018-03-24 DIAGNOSIS — R109 Unspecified abdominal pain: Secondary | ICD-10-CM | POA: Insufficient documentation

## 2018-03-24 DIAGNOSIS — E119 Type 2 diabetes mellitus without complications: Secondary | ICD-10-CM | POA: Diagnosis not present

## 2018-03-24 DIAGNOSIS — J45909 Unspecified asthma, uncomplicated: Secondary | ICD-10-CM | POA: Insufficient documentation

## 2018-03-24 DIAGNOSIS — F17210 Nicotine dependence, cigarettes, uncomplicated: Secondary | ICD-10-CM | POA: Insufficient documentation

## 2018-03-24 DIAGNOSIS — Z79899 Other long term (current) drug therapy: Secondary | ICD-10-CM | POA: Insufficient documentation

## 2018-03-24 LAB — URINALYSIS, COMPLETE (UACMP) WITH MICROSCOPIC
Bacteria, UA: NONE SEEN
Bilirubin Urine: NEGATIVE
Glucose, UA: NEGATIVE mg/dL
KETONES UR: NEGATIVE mg/dL
LEUKOCYTES UA: NEGATIVE
Nitrite: NEGATIVE
PROTEIN: NEGATIVE mg/dL
SQUAMOUS EPITHELIAL / LPF: NONE SEEN (ref 0–5)
Specific Gravity, Urine: 1.015 (ref 1.005–1.030)
pH: 6 (ref 5.0–8.0)

## 2018-03-24 MED ORDER — ONDANSETRON HCL 4 MG/2ML IJ SOLN: 4.0000 mg | Freq: Once | INTRAMUSCULAR | Status: DC

## 2018-03-24 MED ORDER — ONDANSETRON HCL 4 MG PO TABS: 4.0000 mg | ORAL_TABLET | Freq: Every day | ORAL | 0 refills | Status: DC | PRN

## 2018-03-24 MED ORDER — IBUPROFEN 800 MG PO TABS
800.0000 mg | ORAL_TABLET | ORAL | Status: AC
  Administered 2018-03-24: 800 mg via ORAL
  Filled 2018-03-24: qty 1

## 2018-03-24 NOTE — Discharge Instructions (Signed)

## 2018-03-24 NOTE — ED Notes (Addendum)
Pt denied IV access or straight stick for blood. Pt states that he "has been stuck enough this week and does not want to be stuck again." Dr Fanny Bien notified.

## 2018-03-24 NOTE — ED Triage Notes (Signed)
C/o severe left flank pain X 2 days.  Worse when takes deep breath. Denies radiation to groin. No fevers or NVD. No injury. Not worse with movement. VSS. Unlabored.

## 2018-03-24 NOTE — ED Provider Notes (Signed)
[facility] Emergency Department Provider Note ____________________________________________   First MD Initiated Contact with Patient 03/24/18 [phone]     (approximate)  I have reviewed the triage vital signs and the nursing notes.   HISTORY  Chief Complaint Flank Pain   HPI Hunter Monroe is a 47 y.o. male previous history of depression, schizophrenia and homelessness  Patient reports he had discomfort in his left flank for about 2 days.  He denies chest pain or trouble breathing.  Reports when he does move twist bend forward or take a deep breath he has a discomfort seated deeply in his left lower flank, pointing just above the left lateral pelvis.  No chest pain or trouble breathing.  No fevers or chills.  No pain or burning with urination.  Was treated recently for an STD, reports symptoms improved.  Reports that he has had to have some blood work for tuberculosis screening at his job recently and does not want any blood work done today.  Just wants to know what may be causing his discomfort.  Denies history of kidney stones.   Reports a mild to moderate sharp discomfort of the left flank.  No diarrhea.  Some slight constipation.  Passing gas normally.  No vomiting.  No black or bloody stool.  Past Medical History:  Diagnosis Date  . Anxiety   . Asthma   . Bipolar 1 disorder (HCC)   . Depression   . Diabetes mellitus without complication (HCC)   . Homelessness   . Schizophrenia (HCC)     There are no active problems to display for this patient.   History reviewed. No pertinent surgical history.  Prior to Admission medications   Medication Sig Start Date End Date Taking? Authorizing Provider  buPROPion (WELLBUTRIN XL) 300 MG 24 hr tablet Take 300 mg by mouth daily.   Yes [provider]  hydrOXYzine (ATARAX/VISTARIL) 50 MG tablet Take 1 tablet by mouth 4 (four) times daily as needed. 12/17/17  Yes [provider]    paliperidone (INVEGA) 9 MG 24 hr tablet Take 9 mg by mouth every morning.   Yes [provider]  QUEtiapine (SEROQUEL) 400 MG tablet Take 1 tablet by mouth at bedtime. 12/17/17  Yes [provider]  traZODone (DESYREL) 100 MG tablet Take 200 mg by mouth at bedtime. 12/17/17  Yes [provider]  triamcinolone ointment (KENALOG) 0.1 % Apply 1 application topically 2 (two) times daily. 03/16/18  Yes Baity, Salvadore Oxford, NP  cyclobenzaprine (FLEXERIL) 10 MG tablet Take 1 tablet (10 mg total) by mouth 2 (two) times daily as needed for muscle spasms. Patient not taking: Reported on 02/08/2018 02/03/18   Liberty Handy, PA-C  diphenhydrAMINE (BENADRYL) 25 mg capsule Take 1 capsule (25 mg total) by mouth every 4 (four) hours as needed. Patient not taking: Reported on 03/24/2018 06/17/17 06/17/18  Enid Derry, PA-C  HYDROcodone-acetaminophen (NORCO) 5-325 MG tablet Take 1 tablet by mouth every 6 (six) hours as needed for up to 5 doses for severe pain. Patient not taking: Reported on 03/24/2018 02/08/18   Merrily Brittle, MD  ibuprofen (ADVIL,MOTRIN) 600 MG tablet Take 1 tablet (600 mg total) every 6 (six) hours as needed by mouth. Patient not taking: Reported on 03/24/2018 09/09/17   Dartha Lodge, PA-C  naproxen (NAPROSYN) 500 MG tablet Take 1 tablet (500 mg total) by mouth 2 (two) times daily. Patient not taking: Reported on 02/08/2018 02/03/18   Liberty Handy, PA-C  ondansetron (  ZOFRAN) 4 MG tablet Take 1 tablet (4 mg total) by mouth daily as needed for nausea or vomiting. 03/24/18 03/24/19  Sharyn Creamer, MD  predniSONE (DELTASONE) 50 MG tablet Take one 50 mg tablet once daily for the next 5 days. Patient not taking: Reported on 03/24/2018 02/11/18   Orvil Feil, PA-C    Allergies Patient has no known allergies.  History reviewed. No pertinent family history.  Social History Social History   Tobacco Use  . Smoking status: Current Every Day Smoker    Packs/day: 0.25     Types: Cigarettes  . Smokeless tobacco: Never Used  Substance Use Topics  . Alcohol use: Not Currently  . Drug use: No    Review of Systems Constitutional: No fever/chills Eyes: No visual changes. ENT: No sore throat. Cardiovascular: Denies chest pain. Respiratory: Denies shortness of breath. Gastrointestinal: No diarrhea.   Genitourinary: Negative for dysuria. Musculoskeletal: Negative for back pain. Skin: Negative for rash. Neurological: Negative for headaches, focal weakness or numbness.    ____________________________________________   PHYSICAL EXAM:  VITAL SIGNS: ED Triage Vitals  Enc Vitals Group     BP 03/24/18 0812 (!) 109/52     Pulse Rate 03/24/18 0812 84     Resp 03/24/18 0812 16     Temp 03/24/18 0812 98.5 F (36.9 C)     Temp Source 03/24/18 0812 Oral     SpO2 03/24/18 0812 99 %     Weight 03/24/18 0812 150 lb (68 kg)     Height 03/24/18 0812  (1.702 m)     Head Circumference --      Peak Flow --      Pain Score 03/24/18 0811 10     Pain Loc --      Pain Edu? --      Excl. in GC? --     Constitutional: Alert and oriented. Well appearing and in no acute distress.  Initially sleeping on my evaluation, arouses easily to voice.  He is very pleasant. Eyes: Conjunctivae are normal. Head: Atraumatic. Nose: No congestion/rhinnorhea. Mouth/Throat: Mucous membranes are moist. Neck: No stridor.   Cardiovascular: Normal rate, regular rhythm. Grossly normal heart sounds.  Good peripheral circulation. Respiratory: Normal respiratory effort.  No retractions. Lungs CTAB. Gastrointestinal: Soft and nontender except for some mild tenderness which she reports to palpation in the left deep flank and pelvis region without groin pain or mass.  No rebound or guarding any quadrant.. No distention. Musculoskeletal: No lower extremity tenderness nor edema. Neurologic:  Normal speech and language. No gross focal neurologic deficits are appreciated.  Skin:  Skin is  warm, dry and intact. No rash noted. Psychiatric: Mood and affect are slightly flat, but well oriented and very pleasant. Speech and behavior are normal.  ____________________________________________   LABS (all labs ordered are listed, but only abnormal results are displayed)  Labs Reviewed  URINALYSIS, COMPLETE (UACMP) WITH MICROSCOPIC - Abnormal; Notable for the following components:      Result Value   Color, Urine YELLOW (*)    APPearance CLEAR (*)    Hgb urine dipstick SMALL (*)    All other components within normal limits  CBC  COMPREHENSIVE METABOLIC PANEL  LIPASE, BLOOD   ____________________________________________  EKG   ____________________________________________  RADIOLOGY  CT scan negative for acute is reviewed by me. ____________________________________________   PROCEDURES  Procedure(s) performed: None  Procedures  Critical Care performed: No  ____________________________________________   INITIAL IMPRESSION / ASSESSMENT AND PLAN / ED COURSE  Pertinent labs & imaging results that were available during my care of the patient were reviewed by me and considered in my medical decision making (see chart for details).  Differential diagnosis includes but is not limited to, abdominal perforation, aortic dissection, cholecystitis, appendicitis, diverticulitis, colitis, esophagitis/gastritis, kidney stone, pyelonephritis, urinary tract infection, aortic aneurysm. All are considered in decision and treatment plan. Based upon the patient's presentation and risk factors, and overall picture the patient primarily complains of left-sided flank discomfort with a very reassuring examination.  Doubt acute abdomen, but given his concern we will proceed with CT scan to evaluate for potential disease state.  He does not wish to have any blood work done, and we discussed and via shared medical decision making we will not do any blood work but he will continue to monitor  closely for signs and symptoms of worsening and return if he does have anything.   Clinical Course as of Mar 24 1130  Mon Mar 24, 2018  1019 Patient reports he does not wish to submit any blood work.  He had an extensive discussion on the benefits of blood work including checking to make sure his kidneys are working well, looking for signs of inflammation or infection, checking to make sure his blood count was normal, wanted to make sure he does not have a low blood count at risk for an emergent cause of his left flank pain.  However, patient refuses, reports he does not want to have any blood work done today, reports that he has had to have PPD testing in his work and he is done having people try to stick him for blood.  Offered to attempt myself, utilize ultrasound, patient refuses.  Reports he is comfortable with plan for ibuprofen, getting his CAT scan, but does not want to give anything additional for blood work.  Understands exam is been very limited by this, and I cannot exclude many potentially concerning conditions, but again reports he does not wish to have it done.  He appears to have the capacity to make this decision, and thus will proceed without blood work and review CT scan for further.  He does not appear dehydrated, he appears clinically stable, he is afebrile, reassuring clinical examination without evidence of peritonitis.   [MQ]    Clinical Course User Index [MQ] Sharyn Creamer, MD   ----------------------------------------- 11:39 AM on 03/24/2018 -----------------------------------------  Discharge home.  Reports feeling improved after ibuprofen.  Will follow close with his primary doctor.  Carefully reviewed return precautions and again offered blood work, but he can refuses stating he does not wish to have any does not think he needs any done today and after shared medical decision making and the fact the patient appears to have capacity, we will not perform any blood  work.  Return precautions and treatment recommendations and follow-up discussed with the patient who is agreeable with the plan.   ____________________________________________   FINAL CLINICAL IMPRESSION(S) / ED DIAGNOSES  Final diagnoses:  Left flank pain      NEW MEDICATIONS STARTED DURING THIS VISIT:  New Prescriptions   ONDANSETRON (ZOFRAN) 4 MG TABLET    Take 1 tablet (4 mg total) by mouth daily as needed for nausea or vomiting.     Note:  This document was prepared using Dragon voice recognition software and may include unintentional dictation errors.     Sharyn Creamer, MD 03/24/18 1140

## 2018-03-24 NOTE — ED Notes (Signed)
Returned to pts room to discharge.  Gown found on stretcher.  Not able to locate pt x 3

## 2018-03-24 NOTE — ED Notes (Signed)
Pt presents with left lower flank pain with pain "10/10". Pt states that pain has been going on for the last few days. Pt states that he has not had any N/V/D. Pt AxOx4.

## 2018-03-24 NOTE — ED Notes (Signed)
Patient transported to CT 

## 2018-03-29 ENCOUNTER — Encounter (HOSPITAL_COMMUNITY): Payer: Self-pay | Admitting: Emergency Medicine

## 2018-03-29 ENCOUNTER — Emergency Department (HOSPITAL_COMMUNITY): Payer: Medicare Other

## 2018-03-29 ENCOUNTER — Emergency Department (HOSPITAL_COMMUNITY)
Admission: EM | Admit: 2018-03-29 | Discharge: 2018-03-29 | Disposition: A | Discharge status: Home / Self Care | Payer: Medicare Other | Attending: Emergency Medicine | Admitting: Emergency Medicine

## 2018-03-29 ENCOUNTER — Other Ambulatory Visit: Payer: Self-pay

## 2018-03-29 DIAGNOSIS — Z79899 Other long term (current) drug therapy: Secondary | ICD-10-CM | POA: Diagnosis not present

## 2018-03-29 DIAGNOSIS — F17210 Nicotine dependence, cigarettes, uncomplicated: Secondary | ICD-10-CM | POA: Diagnosis not present

## 2018-03-29 DIAGNOSIS — E119 Type 2 diabetes mellitus without complications: Secondary | ICD-10-CM | POA: Insufficient documentation

## 2018-03-29 DIAGNOSIS — R2241 Localized swelling, mass and lump, right lower limb: Secondary | ICD-10-CM | POA: Diagnosis present

## 2018-03-29 DIAGNOSIS — J45909 Unspecified asthma, uncomplicated: Secondary | ICD-10-CM | POA: Insufficient documentation

## 2018-03-29 DIAGNOSIS — M79671 Pain in right foot: Secondary | ICD-10-CM | POA: Insufficient documentation

## 2018-03-29 LAB — CBG MONITORING, ED: Glucose-Capillary: 83 mg/dL (ref 65–99)

## 2018-03-29 MED ORDER — CEPHALEXIN 500 MG PO CAPS: 500.0000 mg | ORAL_CAPSULE | Freq: Four times a day (QID) | ORAL | 0 refills | Status: DC

## 2018-03-29 MED ORDER — NAPROXEN 500 MG PO TABS: 500.0000 mg | ORAL_TABLET | Freq: Two times a day (BID) | ORAL | 0 refills | Status: DC

## 2018-03-29 NOTE — ED Notes (Signed)
Pt upset and wanting to leave. PA talked to Pt and said she would work on his discharge paperwork. RN asked Pt to stay in room while discharge paperwork and prescriptions are printed out. Pt saying he is going to leave and go to [facility].

## 2018-03-29 NOTE — ED Notes (Signed)
Pt has left without his discharge paperwork or his prescriptions.

## 2018-03-29 NOTE — Discharge Instructions (Addendum)
You were seen in the emergency department today for right foot pain.  There does appear to be infection on the top of your right foot.  We are treating this with Keflex, this is an antibiotic. Please take all of your antibiotics until finished. You may develop abdominal discomfort or diarrhea from the antibiotic.  You may help offset this with probiotics which you can buy at the store (ask your pharmacist if unable to find) or get probiotics in the form of eating yogurt. Do not eat or take the probiotics until 2 hours after your antibiotic. If you are unable to tolerate these side effects follow-up with your primary care provider or return to the emergency department.   If you begin to experience any blistering, rashes, swelling, or difficulty breathing seek medical care for evaluation of potentially more serious side effects.   Please be aware that this medication may interact with other medications you are taking, please be sure to discuss your medication list with your pharmacist.   We additionally sending you home with naproxen, this to treat your pain. Naproxen is a nonsteroidal anti-inflammatory medication that will help with pain and swelling. Be sure to take this medication as prescribed with food, 1 pill every 12 hours,  It should be taken with food, as it can cause stomach upset, and more seriously, stomach bleeding. Do not take other nonsteroidal anti-inflammatory medications with this such as Advil, Motrin, or Aleve.   We have prescribed you new medication(s) today. Discuss the medications prescribed today with your pharmacist as they can have adverse effects and interactions with your other medicines including over the counter and prescribed medications. Seek medical evaluation if you start to experience new or abnormal symptoms after taking one of these medicines, seek care immediately if you start to experience difficulty breathing, feeling of your throat closing, facial swelling, or rash as  these could be indications of a more serious allergic reaction   With your history of borderline diabetes is very important that you take good care of your feet.  Youre more at risk to develop infections.  We would really like you to go follow-up with the podiatrist provided in your discharge instructions in the next 3 days for reevaluation and further management.  Return to the emergency department for any new or worsening symptoms including but not limited to worsening pain, worsening swelling, worsening redness, fevers, or any other concerns.   Additionally your blood pressure was elevated in the ER please have this rechecked.

## 2018-03-29 NOTE — ED Provider Notes (Signed)
[facility] EMERGENCY [facility]PARTMENT Provider Note   CSN: [id number] Arrival date & time: 03/29/18  1123     History   Chief Complaint Chief Complaint  Patient presents with  . Feet Swelling    HPI Hunter Monroe is a 47 y.o. male with a hx of tobacco abuse, bipolar 1 diorder, depression, DM, schizophrenia, and homelessness who presents to the ED with complaints of right foot pain and swelling for the past 2 days.  Patient states "I do not take very good care of my feet."  He states that he does a lot of walking at work.  He states he has intermittent problems with his feet swelling and hurting.  He states that the right foot has been hurting and has been swollen over the past 2 days, he states that he thinks this is because of his sandals and where it hits his foot. States pain is moderate. Worse with ambulation and with wearing shoes. Denies fever, chills, nausea, or vomiting.   HPI  Past Medical History:  Diagnosis Date  . Anxiety   . Asthma   . Bipolar 1 disorder (HCC)   . Depression   . Diabetes mellitus without complication (HCC)   . Homelessness   . Schizophrenia (HCC)     There are no active problems to display for this patient.   History reviewed. No pertinent surgical history.      Home Medications    Prior to Admission medications   Medication Sig Start Date End Date Taking? Authorizing Provider  buPROPion (WELLBUTRIN XL) 300 MG 24 hr tablet Take 300 mg by mouth daily.    [provider]  cyclobenzaprine (FLEXERIL) 10 MG tablet Take 1 tablet (10 mg total) by mouth 2 (two) times daily as needed for muscle spasms. Patient not taking: Reported on 02/08/2018 02/03/18   Liberty HandyGibbons, Claudia J, PA-C  diphenhydrAMINE (BENADRYL) 25 mg capsule Take 1 capsule (25 mg total) by mouth every 4 (four) hours as needed. Patient not taking: Reported on 03/24/2018 06/17/17 06/17/18  Enid DerryWagner, Ashley, PA-C  HYDROcodone-acetaminophen (NORCO) 5-325 MG tablet Take 1  tablet by mouth every 6 (six) hours as needed for up to 5 doses for severe pain. Patient not taking: Reported on 03/24/2018 02/08/18   Merrily Brittleifenbark, Neil, MD  hydrOXYzine (ATARAX/VISTARIL) 50 MG tablet Take 1 tablet by mouth 4 (four) times daily as needed. 12/17/17   [provider]  ibuprofen (ADVIL,MOTRIN) 600 MG tablet Take 1 tablet (600 mg total) every 6 (six) hours as needed by mouth. Patient not taking: Reported on 03/24/2018 09/09/17   Dartha LodgeFord, Kelsey N, PA-C  naproxen (NAPROSYN) 500 MG tablet Take 1 tablet (500 mg total) by mouth 2 (two) times daily. Patient not taking: Reported on 02/08/2018 02/03/18   Liberty HandyGibbons, Claudia J, PA-C  ondansetron [facility](ZOFRAN) 4 MG tablet Take 1 tablet (4 mg total) by mouth daily as needed for nausea or vomiting. 03/24/18 03/24/19  Sharyn CreamerQuale, Mark, MD  paliperidone (INVEGA) 9 MG 24 hr tablet Take 9 mg by mouth every morning.    [provider]  predniSONE (DELTASONE) 50 MG tablet Take one 50 mg tablet once daily for the next 5 days. Patient not taking: Reported on 03/24/2018 02/11/18   Orvil FeilWoods, Jaclyn M, PA-C  QUEtiapine (SEROQUEL) 400 MG tablet Take 1 tablet by mouth at bedtime. 12/17/17   [provider]  traZODone (DESYREL) 100 MG tablet Take 200 mg by mouth at bedtime. 12/17/17   [provider]  triamcinolone ointment (KENALOG)  0.1 % Apply 1 application topically 2 (two) times daily. 03/16/18   [name], NP    Family History No family history on file.  Social History Social History   Tobacco Use  . Smoking status: Current Every Day Smoker    Packs/day: 0.25    Types: Cigarettes  . Smokeless tobacco: Never Used  Substance Use Topics  . Alcohol use: Not Currently  . Drug use: No     Allergies   Patient has no known allergies.   Review of Systems Review of Systems  Constitutional: Negative for chills and fever.  Musculoskeletal:       Positive for R foot pain/swelling  Neurological: Negative for weakness and numbness.    Physical Exam Updated Vital Signs BP 129/90   Pulse 97   Temp 98.5 F (36.9 C) (Oral)   Resp 15   SpO2 100%   Physical Exam  Constitutional: He appears well-developed and well-nourished. No distress.  HENT:  Head: Normocephalic and atraumatic.  Eyes: Conjunctivae are normal. Right eye exhibits no discharge. Left eye exhibits no discharge.  Cardiovascular:  2+ symmetric DP pulses  Musculoskeletal:  Normal ROM to bilateral ankles. Patient able to move all toes. Compartments are soft. There is soft tissue swelling over the dorsum of the R foot with overlying erythema and warmth- this area is tender to palpation. The erythema does not streak.   Neurological: He is alert.  Clear speech.   Skin:  Feet: patient has several areas of thickened skin, especially at the heels bilaterally. L foot: There appears to be a callous/corn to the plantar surface of the left digit. R foot: There is an area of dry skin, erythema, and warmth to the dorsum of the foot, does not appear to have a break in the skin at this site. There is a callous/corn to the plantar/medial aspect of the 1st digit as well as to the 5th MTP region.   Psychiatric: He has a normal mood and affect. His behavior is normal. Thought content normal.  Nursing note and vitals reviewed.            ED Treatments / Results  Labs Results for orders placed or performed during the hospital encounter of 03/29/18  POC CBG, ED  Result Value Ref Range   Glucose-Capillary 83 65 - 99 mg/dL    EKG None  Radiology Dg Foot Complete Right  Result Date: 03/29/2018 CLINICAL DATA:  Bilateral feet swelling for 2 days, RIGHT greater than LEFT. RIGHT foot pain greater than LEFT. Borderline diabetic. EXAM: RIGHT FOOT COMPLETE - 3+ VIEW COMPARISON:  None. FINDINGS: There is no evidence of fracture or dislocation. There is no evidence of arthropathy or other focal bone abnormality. Soft tissues are unremarkable. IMPRESSION: Negative.  Questionable skin lesion lateral to the fifth MTP joint (physical exam correlation recommended). Electronically Signed   By: Bary Richard M.D.   On: 03/29/2018 14:39    Procedures Procedures (including critical care time)  Medications Ordered in ED Medications - No data to display   Initial Impression / Assessment and Plan / ED Course  I have reviewed the triage vital signs and the nursing notes.  Pertinent labs & imaging results that were available during my care of the patient were reviewed by me and considered in my medical decision making (see chart for details).   Patient presents with R foot pain/swelling. Patient nontoxic appearing, in no apparent distress. Exam as above. Hx of DM, CBG 83 in the ER  today. X-ray without acute fracture or dislocation. There is no obvious osteomyelitis.  Erythema over the dorsum of the foot suspicious for cellulitis, patient has good ROM at all surrounding joints, do not suspect septic joint. Considering gout as well. Will treat with Keflex and Naproxen with Podiatry follow up. Findings and plan of care discussed with supervising physician Dr. Particia Nearing who is in agreement with plan.   Patient became upset regarding wait times. I explained that I was in the process of preparing his discharge instructions.  When myself and RN attempted discharge patient had left shortly after prior conversation. He left without his prescriptions or discharge instructions. I attempted to call the patient with the number provided in his chart. He did not answer and it did not take me to a voicemail. Will re-attempt. Discharge papers left at desk for possible pick up.    Final Clinical Impressions(s) / ED Diagnoses   Final diagnoses:  Right foot pain    ED Discharge Orders        Ordered    cephALEXin (KEFLEX) 500 MG capsule  4 times daily     03/29/18 1509    naproxen (NAPROSYN) 500 MG tablet  2 times daily     03/29/18 1509       Keyerra Lamere, [name] [name],  PA-C 03/29/18 1622    Jacalyn Lefevre, MD 03/30/18 [phone]

## 2018-03-29 NOTE — ED Triage Notes (Signed)
Patient to ED c/o bilateral feet swelling x 2 days - states he has been working a lot and has been on his feet all day. Full ROM, pulses equal bilaterally. Ambulatory without difficulty.

## 2018-03-30 ENCOUNTER — Encounter (HOSPITAL_COMMUNITY): Payer: Self-pay | Admitting: Emergency Medicine

## 2018-03-30 ENCOUNTER — Emergency Department (HOSPITAL_COMMUNITY)
Admission: EM | Admit: 2018-03-30 | Discharge: 2018-03-30 | Disposition: A | Discharge status: Home / Self Care | Payer: Medicare Other | Attending: Emergency Medicine | Admitting: Emergency Medicine

## 2018-03-30 DIAGNOSIS — Z79899 Other long term (current) drug therapy: Secondary | ICD-10-CM | POA: Diagnosis not present

## 2018-03-30 DIAGNOSIS — F17210 Nicotine dependence, cigarettes, uncomplicated: Secondary | ICD-10-CM | POA: Insufficient documentation

## 2018-03-30 DIAGNOSIS — L02611 Cutaneous abscess of right foot: Secondary | ICD-10-CM | POA: Diagnosis not present

## 2018-03-30 DIAGNOSIS — J45909 Unspecified asthma, uncomplicated: Secondary | ICD-10-CM | POA: Diagnosis not present

## 2018-03-30 DIAGNOSIS — E119 Type 2 diabetes mellitus without complications: Secondary | ICD-10-CM | POA: Insufficient documentation

## 2018-03-30 MED ORDER — CEPHALEXIN 250 MG PO CAPS
500.0000 mg | ORAL_CAPSULE | Freq: Once | ORAL | Status: AC
  Administered 2018-03-30: 500 mg via ORAL
  Filled 2018-03-30: qty 2

## 2018-03-30 MED ORDER — NAPROXEN 250 MG PO TABS
500.0000 mg | ORAL_TABLET | Freq: Once | ORAL | Status: DC
  Filled 2018-03-30: qty 2

## 2018-03-30 MED ORDER — IBUPROFEN 800 MG PO TABS: 800.0000 mg | ORAL_TABLET | Freq: Three times a day (TID) | ORAL | 0 refills | Status: DC

## 2018-03-30 MED ORDER — SULFAMETHOXAZOLE-TRIMETHOPRIM 800-160 MG PO TABS: 1.0000 | ORAL_TABLET | Freq: Two times a day (BID) | ORAL | 0 refills | Status: AC

## 2018-03-30 MED ORDER — LIDOCAINE-EPINEPHRINE (PF) 2 %-1:200000 IJ SOLN
10.0000 mL | Freq: Once | INTRAMUSCULAR | Status: AC
  Administered 2018-03-30: 10 mL
  Filled 2018-03-30: qty 20

## 2018-03-30 MED ORDER — SULFAMETHOXAZOLE-TRIMETHOPRIM 800-160 MG PO TABS
1.0000 | ORAL_TABLET | Freq: Once | ORAL | Status: AC
  Administered 2018-03-30: 1 via ORAL
  Filled 2018-03-30: qty 1

## 2018-03-30 MED ORDER — CEPHALEXIN 500 MG PO CAPS: 500.0000 mg | ORAL_CAPSULE | Freq: Three times a day (TID) | ORAL | 0 refills | Status: DC

## 2018-03-30 NOTE — Discharge Instructions (Addendum)
Soak your foot in antibacterial soapy warm water several times a day.  Keep it clean.  Take both antibiotics as prescribed until gone.  Take ibuprofen for pain.  Keep your foot elevated.  Follow-up as needed return if worsening swelling, redness, pain, fever.

## 2018-03-30 NOTE — ED Triage Notes (Addendum)
Patient complains of right foot swelling x1 week. Patient reports minimal pain at rest but with palpation pain increases significantly. Right foot is swollen, skin is dry and cracked.

## 2018-03-30 NOTE — ED Notes (Signed)
Pt seen at ED yesterday and left before discharge prescriptions and instructions were given to him.

## 2018-03-30 NOTE — ED Notes (Signed)
Patient refused any type of blood testing.

## 2018-03-30 NOTE — ED Provider Notes (Signed)
[facility] EMERGENCY DEPARTMENT Provider Note   CSN: [id number] Arrival date & time: 03/30/18  1217     History   Chief Complaint Chief Complaint  Patient presents with  . Foot Pain    HPI Hunter Monroe is a 47 y.o. male.  HPI Hunter Monroe is a 47 y.o. male with history of asthma, bipolar disorder, diabetes, schizophrenia, presents to emergency department complaining of right foot pain.  Patient noted swelling to the right dorsal foot several days ago.  He was seen in emergency department yesterday, diagnosed with possible cellulitis, started on antibiotics which he did not get.  He states he left the ED in a hurry without receiving his discharge papers.  He states he continues to have pain to the dorsal right foot.  No injuries.  No fever or chills.  Foot is red, warm, swollen.  It is tender to the touch and pain with walking.  Past Medical History:  Diagnosis Date  . Anxiety   . Asthma   . Bipolar 1 disorder (HCC)   . Depression   . Diabetes mellitus without complication (HCC)   . Homelessness   . Schizophrenia (HCC)     There are no active problems to display for this patient.   History reviewed. No pertinent surgical history.      Home Medications    Prior to Admission medications   Medication Sig Start Date End Date Taking? Authorizing Provider  buPROPion (WELLBUTRIN XL) 300 MG 24 hr tablet Take 300 mg by mouth daily.    [provider]  cephALEXin (KEFLEX) 500 MG capsule Take 1 capsule (500 mg total) by mouth 4 (four) times daily. 03/29/18   [name], Samantha R, PA-C  hydrOXYzine (ATARAX/VISTARIL) 50 MG tablet Take 1 tablet by mouth 4 (four) times daily as needed. 12/17/17   [provider]  naproxen (NAPROSYN) 500 MG tablet Take 1 tablet (500 mg total) by mouth 2 (two) times daily. 03/29/18   [name], Samantha R, PA-C  ondansetron (ZOFRAN) 4 MG tablet Take 1 tablet (4 mg total) by mouth daily as needed for nausea or  vomiting. 03/24/18 03/24/19  Sharyn Creamer, MD  paliperidone (INVEGA) 9 MG 24 hr tablet Take 9 mg by mouth every morning.    [provider]  QUEtiapine (SEROQUEL) 400 MG tablet Take 1 tablet by mouth at bedtime. 12/17/17   [provider]  traZODone (DESYREL) 100 MG tablet Take 200 mg by mouth at bedtime. 12/17/17   [provider]  triamcinolone ointment (KENALOG) 0.1 % Apply 1 application topically 2 (two) times daily. 03/16/18   [name], NP    Family History No family history on file.  Social History Social History   Tobacco Use  . Smoking status: Current Every Day Smoker    Packs/day: 0.25    Types: Cigarettes  . Smokeless tobacco: Never Used  Substance Use Topics  . Alcohol use: Not Currently  . Drug use: No     Allergies   Naproxen   Review of Systems Review of Systems  Constitutional: Negative for chills and fever.  Respiratory: Negative for cough, chest tightness and shortness of breath.   Cardiovascular: Negative for chest pain, palpitations and leg swelling.  Gastrointestinal: Negative for abdominal distention, abdominal pain, diarrhea, nausea and vomiting.  Genitourinary: Negative for dysuria, frequency, hematuria and urgency.  Musculoskeletal: Positive for arthralgias, joint swelling and myalgias. Negative for neck pain and neck stiffness.  Skin: Negative for rash.  Allergic/Immunologic:  Negative for immunocompromised state.  Neurological: Negative for dizziness, weakness, light-headedness, numbness and headaches.  All other systems reviewed and are negative.    Physical Exam Updated Vital Signs BP 131/88 (BP Location: Right Arm)   Pulse 100   Temp 97.8 F (36.6 C) (Oral)   Resp 16   Ht 5\' 7"  (1.702 m)   Wt 68 kg (150 lb)   SpO2 100%   BMI 23.49 kg/m   Physical Exam  Constitutional: He appears well-developed and well-nourished. No distress.  Eyes: Conjunctivae are normal.  Neck: Neck supple.  Cardiovascular: Normal  rate.  Pulmonary/Chest: No respiratory distress.  Abdominal: He exhibits no distension.  Musculoskeletal:  Swelling to the right dorsal foot.  There is an area approximately 3 x 3 cm fluctuance.  There is erythema, it is warm to the touch, it is tender.  Capillary refill less than 2 seconds distally.  Normal ankle.  Skin: Skin is warm and dry.  Nursing note and vitals reviewed.    ED Treatments / Results  Labs (all labs ordered are listed, but only abnormal results are displayed) Labs Reviewed - No data to display  EKG None  Radiology Dg Foot Complete Right  Result Date: 03/29/2018 CLINICAL DATA:  Bilateral feet swelling for 2 days, RIGHT greater than LEFT. RIGHT foot pain greater than LEFT. Borderline diabetic. EXAM: RIGHT FOOT COMPLETE - 3+ VIEW COMPARISON:  None. FINDINGS: There is no evidence of fracture or dislocation. There is no evidence of arthropathy or other focal bone abnormality. Soft tissues are unremarkable. IMPRESSION: Negative. Questionable skin lesion lateral to the fifth MTP joint (physical exam correlation recommended). Electronically Signed   By: Bary RichardStan  Maynard M.D.   On: 03/29/2018 14:39    Procedures .Marland Kitchen.Incision and Drainage Date/Time: 03/30/2018 3:13 PM Performed by: Jaynie CrumbleKirichenko, Rashi Granier, PA-C Authorized by: Jaynie CrumbleKirichenko, Trenice Mesa, PA-C   Consent:    Consent obtained:  Verbal   Consent given by:  Patient   Risks discussed:  Bleeding, incomplete drainage and pain Location:    Type:  Abscess   Location:  Lower extremity   Lower extremity location:  Foot   Foot location:  R foot Pre-procedure details:    Skin preparation:  Betadine Anesthesia (see MAR for exact dosages):    Anesthesia method:  Local infiltration   Local anesthetic:  Lidocaine 2% WITH epi Procedure type:    Complexity:  Simple Procedure details:    Incision types:  Stab incision   Scalpel blade:  11   Wound management:  Probed and deloculated and irrigated with saline   Drainage:   Purulent   Drainage amount:  Copious   Wound treatment:  Wound left open Post-procedure details:    Patient tolerance of procedure:  Tolerated well, no immediate complications   (including critical care time)  Medications Ordered in ED Medications  naproxen (NAPROSYN) tablet 500 mg (500 mg Oral Refused 03/30/18 1423)  lidocaine-EPINEPHrine (XYLOCAINE W/EPI) 2 %-1:200000 (PF) injection 10 mL (has no administration in time range)     Initial Impression / Assessment and Plan / ED Course  I have reviewed the triage vital signs and the nursing notes.  Pertinent labs & imaging results that were available during my care of the patient were reviewed by me and considered in my medical decision making (see chart for details).     Patient with right foot swelling with some fluctuance on my exam.  Foot is erythematous, it is warm, it is tender.  I used bedside ultrasound to evaluate for  possible collection of fluid given the amount of fluctuance.  Ultrasound showed possible fluid collection, concerning for an abscess.  I incised and drained dorsal foot, with a very tiny incision.  Patient refused numbing medicine as soon as I tried to numb his foot, he began screaming stating to take the needle out of his foot.  I was only able to numb a small area, just enough for a small incision.  She refused further numbing or further incision.  I was able to get very large amount of purulent drainage.  I irrigated with saline.  The incision is not large enough unfortunately for any packing or any further debridement.  He is refusing any further treatment at this time.  I was able to get as much purulence out as possible.  I will start him on Bactrim and Keflex to cover for all different possible floor and I have sent culture of the drainage.  We will have him follow-up with his doctor, return precautions discussed.  Vitals:   03/30/18 1239 03/30/18 1400  BP: 126/73 131/88  Pulse: 99 100  Resp: 16 16  Temp: 97.8  F (36.6 C)   TempSrc: Oral   SpO2: 100% 100%  Weight: 68 kg (150 lb)   Height: 5\' 7"  (1.702 m)      Final Clinical Impressions(s) / ED Diagnoses   Final diagnoses:  Foot abscess, right    ED Discharge Orders        Ordered    sulfamethoxazole-trimethoprim (BACTRIM DS,SEPTRA DS) 800-160 MG tablet  2 times daily     03/30/18 1517    cephALEXin (KEFLEX) 500 MG capsule  3 times daily     03/30/18 1517    ibuprofen (ADVIL,MOTRIN) 800 MG tablet  3 times daily     03/30/18 1517       Jaynie Crumble, PA-C 03/30/18 1518    Gwyneth Sprout, MD 03/30/18 2134

## 2018-03-31 ENCOUNTER — Emergency Department
Admission: EM | Admit: 2018-03-31 | Discharge: 2018-03-31 | Disposition: A | Discharge status: Home / Self Care | Payer: Medicare Other | Attending: Emergency Medicine | Admitting: Emergency Medicine

## 2018-03-31 ENCOUNTER — Other Ambulatory Visit: Payer: Self-pay

## 2018-03-31 ENCOUNTER — Encounter: Payer: Self-pay | Admitting: Emergency Medicine

## 2018-03-31 DIAGNOSIS — Z5321 Procedure and treatment not carried out due to patient leaving prior to being seen by health care provider: Secondary | ICD-10-CM | POA: Diagnosis not present

## 2018-03-31 DIAGNOSIS — R2241 Localized swelling, mass and lump, right lower limb: Secondary | ICD-10-CM | POA: Insufficient documentation

## 2018-03-31 NOTE — ED Triage Notes (Signed)
Patient ambulatory to triage with steady gait, without difficulty or distress noted; pt reports seen yesterday for right foot pain/swelling but no improvement; rx keflex and bactrim and had I&D of abscess to right foot

## 2018-03-31 NOTE — ED Notes (Signed)
Pt becomes upset when asked if he was seen twice at [facility]; st he left first time because of the long wait; when asked if he had labwork pt st yes; upon review of chart, no lab results found but pt is adamant that it was done and again becomes upset, stating "I don't know what blood work has to do with my feet!"; explained importance of having such done due to his hx of diabetes and cellulitis but pt yelling "I ain't having no blood drawn I just want my feet looked at!"; pt then leaves triage, [facility]

## 2018-04-01 ENCOUNTER — Encounter: Payer: Self-pay | Admitting: Emergency Medicine

## 2018-04-01 ENCOUNTER — Emergency Department
Admission: EM | Admit: 2018-04-01 | Discharge: 2018-04-01 | Disposition: A | Discharge status: Home / Self Care | Payer: Medicare Other | Attending: Emergency Medicine | Admitting: Emergency Medicine

## 2018-04-01 ENCOUNTER — Other Ambulatory Visit: Payer: Self-pay

## 2018-04-01 DIAGNOSIS — M25571 Pain in right ankle and joints of right foot: Secondary | ICD-10-CM | POA: Diagnosis present

## 2018-04-01 DIAGNOSIS — Z79899 Other long term (current) drug therapy: Secondary | ICD-10-CM | POA: Insufficient documentation

## 2018-04-01 DIAGNOSIS — E119 Type 2 diabetes mellitus without complications: Secondary | ICD-10-CM | POA: Diagnosis not present

## 2018-04-01 DIAGNOSIS — F17210 Nicotine dependence, cigarettes, uncomplicated: Secondary | ICD-10-CM | POA: Diagnosis not present

## 2018-04-01 DIAGNOSIS — J45909 Unspecified asthma, uncomplicated: Secondary | ICD-10-CM | POA: Insufficient documentation

## 2018-04-01 DIAGNOSIS — M25471 Effusion, right ankle: Secondary | ICD-10-CM | POA: Insufficient documentation

## 2018-04-01 LAB — COMPREHENSIVE METABOLIC PANEL
ALBUMIN: 4.2 g/dL (ref 3.5–5.0)
ALK PHOS: 48 U/L (ref 38–126)
ALT: 11 U/L — ABNORMAL LOW (ref 17–63)
ANION GAP: 11 (ref 5–15)
AST: 19 U/L (ref 15–41)
BILIRUBIN TOTAL: 0.3 mg/dL (ref 0.3–1.2)
BUN: 7 mg/dL (ref 6–20)
CALCIUM: 9 mg/dL (ref 8.9–10.3)
CO2: 22 mmol/L (ref 22–32)
CREATININE: 1.09 mg/dL (ref 0.61–1.24)
Chloride: 104 mmol/L (ref 101–111)
GFR calc non Af Amer: >60 mL/min (ref >60)
GLUCOSE: 89 mg/dL (ref 65–99)
Potassium: 3.8 mmol/L (ref 3.5–5.1)
Sodium: 137 mmol/L (ref 135–145)
TOTAL PROTEIN: 7.8 g/dL (ref 6.5–8.1)

## 2018-04-01 LAB — CBC WITH DIFFERENTIAL/PLATELET
BASOS ABS: 0 10*3/uL (ref 0–0.1)
BASOS PCT: 1 %
EOS ABS: 0.2 10*3/uL (ref 0–0.7)
EOS PCT: 3 %
HEMATOCRIT: 40.7 % (ref 40.0–52.0)
Hemoglobin: 13.1 g/dL (ref 13.0–18.0)
Lymphocytes Relative: 34 %
Lymphs Abs: 2.2 10*3/uL (ref 1.0–3.6)
MCH: 23.9 pg — ABNORMAL LOW (ref 26.0–34.0)
MCHC: 32.2 g/dL (ref 32.0–36.0)
MCV: 74.4 fL — ABNORMAL LOW (ref 80.0–100.0)
Monocytes Absolute: 0.8 10*3/uL (ref 0.2–1.0)
Monocytes Relative: 12 %
NEUTROS ABS: 3.2 10*3/uL (ref 1.4–6.5)
Neutrophils Relative %: 50 %
PLATELETS: 204 10*3/uL (ref 150–440)
RBC: 5.47 MIL/uL (ref 4.40–5.90)
RDW: 14.2 % (ref 11.5–14.5)
WBC: 6.3 10*3/uL (ref 3.8–10.6)

## 2018-04-01 LAB — AEROBIC CULTURE W GRAM STAIN (SUPERFICIAL SPECIMEN): Gram Stain: NONE SEEN

## 2018-04-01 LAB — AEROBIC CULTURE  (SUPERFICIAL SPECIMEN)

## 2018-04-01 LAB — LACTIC ACID, PLASMA: Lactic Acid, Venous: 1.9 mmol/L (ref 0.5–1.9)

## 2018-04-01 MED ORDER — CLINDAMYCIN HCL 150 MG PO CAPS: 450.0000 mg | ORAL_CAPSULE | Freq: Three times a day (TID) | ORAL | 0 refills | Status: DC

## 2018-04-01 NOTE — ED Triage Notes (Signed)
Pt reports foot infection to right foot for over 1 week, [facility] same on 6/1 and 6/2.  [facility], but left without being seen after triage because he refused to have blood work drawn.   Pt states abscess to right foot was drained, but states the lateral ankle was not drained and needs to be drained. Lateral ankle is edematous. Feet are dry and flaking.

## 2018-04-01 NOTE — ED Notes (Signed)
Pt ankle/foot wrapped with ace bandage per MD request

## 2018-04-01 NOTE — ED Provider Notes (Signed)
[facility] Emergency Department Provider Note  ____________________________________________  Time seen: Approximately 6:39 PM  I have reviewed the triage vital signs and the nursing notes.   HISTORY  Chief Complaint Wound Infection (Right Foot)  Level 5 Caveat: Portions of the History and Physical including history of present illness are unable to be adequately obtained due to patient being a poor historian   HPI Hunter Monroe is a 47 y.o. male with a history of diabetes, schizophrenia and bipolar who complains of swelling in the right foot and ankle for the past week. He reports being seen multiple times over this time period, he is taking Keflex and Bactrim for a week and reports that they have not helped at all.patient denies any pain. Denies fever or chills or sweats.  Review of electronic medical record shows that he was at [facility] 2 days ago. He was identified to have a fluctuant area on the dorsum of the foot which was incised and drained of a large amount of purulent fluid. At the time the foot was erythematous and indurated and appeared cellulitic.      Past Medical History:  Diagnosis Date  . Anxiety   . Asthma   . Bipolar 1 disorder (HCC)   . Depression   . Diabetes mellitus without complication (HCC)   . Homelessness   . Schizophrenia (HCC)      There are no active problems to display for this patient.    History reviewed. No pertinent surgical history.   Prior to Admission medications   Medication Sig Start Date End Date Taking? Authorizing Provider  buPROPion (WELLBUTRIN XL) 300 MG 24 hr tablet Take 300 mg by mouth daily.    [provider]  cephALEXin (KEFLEX) 500 MG capsule Take 1 capsule (500 mg total) by mouth 3 (three) times daily. 03/30/18   Kirichenko, Tatyana, PA-C  clindamycin (CLEOCIN) 150 MG capsule Take 3 capsules (450 mg total) by mouth 3 (three) times daily. 04/01/18   Sharman Cheek, MD  hydrOXYzine  (ATARAX/VISTARIL) 50 MG tablet Take 1 tablet by mouth 4 (four) times daily as needed. 12/17/17   [provider]  ibuprofen (ADVIL,MOTRIN) 800 MG tablet Take 1 tablet (800 mg total) by mouth 3 (three) times daily. 03/30/18   Kirichenko, Tatyana, PA-C  naproxen (NAPROSYN) 500 MG tablet Take 1 tablet (500 mg total) by mouth 2 (two) times daily. 03/29/18   [name], Samantha R, PA-C  ondansetron (ZOFRAN) 4 MG tablet Take 1 tablet (4 mg total) by mouth daily as needed for nausea or vomiting. 03/24/18 03/24/19  Sharyn Creamer, MD  paliperidone (INVEGA) 9 MG 24 hr tablet Take 9 mg by mouth every morning.    [provider]  QUEtiapine (SEROQUEL) 400 MG tablet Take 1 tablet by mouth at bedtime. 12/17/17   [provider]  sulfamethoxazole-trimethoprim (BACTRIM DS,SEPTRA DS) 800-160 MG tablet Take 1 tablet by mouth 2 (two) times daily for 7 days. 03/30/18 04/06/18  Kirichenko, Lemont Fillers, PA-C  traZODone (DESYREL) 100 MG tablet Take 200 mg by mouth at bedtime. 12/17/17   [provider]  triamcinolone ointment (KENALOG) 0.1 % Apply 1 application topically 2 (two) times daily. 03/16/18   [name], NP     Allergies Naproxen   History reviewed. No pertinent family history.  Social History Social History   Tobacco Use  . Smoking status: Current Every Day Smoker    Packs/day: 0.25    Types: Cigarettes  . Smokeless tobacco: Never Used  Substance Use Topics  . Alcohol use: Not Currently  . Drug use: No    Review of Systems  Constitutional:   No fever or chills.  Cardiovascular:   No chest pain or syncope. Respiratory:   No dyspnea or cough. Gastrointestinal:   Negative for abdominal pain, vomiting and diarrhea.  Musculoskeletal:   Negative for focal pain, positive right ankle swelling All other systems reviewed and are negative except as documented above in ROS and HPI.  ____________________________________________   PHYSICAL EXAM:  VITAL SIGNS: ED Triage  Vitals  Enc Vitals Group     BP 04/01/18 1543 125/75     Pulse Rate 04/01/18 1543 94     Resp 04/01/18 1543 18     Temp 04/01/18 1543 98.6 F (37 C)     Temp Source 04/01/18 1543 Oral     SpO2 04/01/18 1543 99 %     Weight 04/01/18 1544 150 lb (68 kg)     Height 04/01/18 1544 5\' 7"  (1.702 m)     Head Circumference --      Peak Flow --      Pain Score 04/01/18 1557 6     Pain Loc --      Pain Edu? --      Excl. in GC? --     Vital signs reviewed, nursing assessments reviewed.   Constitutional:   Alert and oriented. Well appearing. Eyes:   Conjunctivae are normal. EOMI.  ENT      Head:   Normocephalic and atraumatic.            Neck:   No meningismus. Full ROM.  Cardiovascular:   RRR. Symmetric bilateral radial and DP pulses.  Respiratory:   Normal respiratory effort without tachypnea/retractions.   Musculoskeletal:   Normal range of motion in all extremities. No joint effusions. right foot has swelling over the ankle and dorsum without induration or erythema or purulent drainage. The incision point on the dorsal foot is healing well. No specific tenderness. No warmth.  point of care ultrasound performed by me at the bedside, reveals cobblestoning appearance of the superficial soft tissues in the area of the foot and ankle without fluid collection. No evidence of abscess. Neurologic:   Normal speech and language.  Motor grossly intact. No acute focal neurologic deficits are appreciated.  Skin:    Skin is warm, dry and intact. No rash noted.  No petechiae, purpura, or bullae.  ____________________________________________    LABS (pertinent positives/negatives) (all labs ordered are listed, but only abnormal results are displayed) Labs Reviewed  COMPREHENSIVE METABOLIC PANEL - Abnormal; Notable for the following components:      Result Value   ALT 11 (*)    All other components within normal limits  CBC WITH DIFFERENTIAL/PLATELET - Abnormal; Notable for the following  components:   MCV 74.4 (*)    MCH 23.9 (*)    All other components within normal limits  LACTIC ACID, PLASMA  LACTIC ACID, PLASMA   ____________________________________________   EKG    ____________________________________________    RADIOLOGY  No results found.  ____________________________________________   PROCEDURES Procedures  ____________________________________________    CLINICAL IMPRESSION / ASSESSMENT AND PLAN / ED COURSE  Pertinent labs & imaging results that were available during my care of the patient were reviewed by me and considered in my medical decision making (see chart for details).    patient presents with persistent right ankle swelling, no pain, in the setting of one week of antibiotic  use for foot cellulitis and having had I and D 2 days ago.  review of the electronic medical record shows that a wound culture from the incision and drainage 2 days ago was positive for staph aureus which is pansensitive. This confirms that his antibiotic regimen should be efficacious. He has no white blood cell count elevation, no fever. His labs are normal. I think his swelling is persistent dependent edema and there is no evidence of persistent cellulitis or abscess at this time. Counseled the patient on rest ice compression and elevation. I will continue him on antibiotics due to his diabetes and mental health issues to ensure adequate clearance of his infection. I'll switch to clindamycin at this point although any appropriate antibiotic should work.      ____________________________________________   FINAL CLINICAL IMPRESSION(S) / ED DIAGNOSES    Final diagnoses:  Right ankle swelling     ED Discharge Orders        Ordered    clindamycin (CLEOCIN) 150 MG capsule  3 times daily     04/01/18 1838      Portions of this note were generated with dragon dictation software. Dictation errors may occur despite best attempts at proofreading.     Sharman CheekStafford, Ernestine Langworthy, MD 04/01/18 [id number]

## 2018-04-01 NOTE — ED Notes (Addendum)
Pt in hurry to leave, states " I have somewhere to be". unwilling to take vital signs prior to discharge.

## 2018-04-01 NOTE — ED Notes (Signed)
Pt had been refusing lab work previous, but I explained to reason for blood work and pt agreeable with plan. Pt wheeled back to the lobby to wait.

## 2018-04-02 ENCOUNTER — Telehealth: Payer: Self-pay | Admitting: Emergency Medicine

## 2018-04-02 NOTE — Telephone Encounter (Signed)
Post ED Visit - Positive Culture Follow-up  Culture report reviewed by antimicrobial stewardship pharmacist:  []  Enzo BiNathan Batchelder, Pharm.D. []  Celedonio MiyamotoJeremy Frens, Pharm.D., BCPS AQ-ID []  Garvin FilaMike Maccia, Pharm.D., BCPS []  Georgina PillionElizabeth Martin, Pharm.D., BCPS []  [place] BeachMinh Pham, 17[address] Rainbow BoulevardPharm.D., BCPS, AAHIVP []  Estella HuskMichelle Turner, Pharm.D., BCPS, AAHIVP []  Lysle Pearlachel Rumbarger, PharmD, BCPS []  Sherlynn CarbonAustin Lucas, PharmD []  Pollyann SamplesAndy Johnston, PharmD, BCPS [facility] LLCBrooke Baggett PharmD  Positive wound culture Treated with bactrim DS, organism sensitive to the same and no further patient follow-up is required at this time.  Berle MullMiller, Arlene Brickel 04/02/2018, 4:22 PM

## 2018-06-21 ENCOUNTER — Emergency Department (HOSPITAL_COMMUNITY): Payer: Medicare Other

## 2018-06-21 ENCOUNTER — Emergency Department (HOSPITAL_COMMUNITY)
Admission: EM | Admit: 2018-06-21 | Discharge: 2018-06-21 | Disposition: A | Discharge status: Home / Self Care | Payer: Medicare Other | Attending: Emergency Medicine | Admitting: Emergency Medicine

## 2018-06-21 ENCOUNTER — Encounter (HOSPITAL_COMMUNITY): Payer: Self-pay | Admitting: Emergency Medicine

## 2018-06-21 DIAGNOSIS — Z79899 Other long term (current) drug therapy: Secondary | ICD-10-CM | POA: Insufficient documentation

## 2018-06-21 DIAGNOSIS — F17210 Nicotine dependence, cigarettes, uncomplicated: Secondary | ICD-10-CM | POA: Diagnosis not present

## 2018-06-21 DIAGNOSIS — E119 Type 2 diabetes mellitus without complications: Secondary | ICD-10-CM | POA: Insufficient documentation

## 2018-06-21 DIAGNOSIS — R0789 Other chest pain: Secondary | ICD-10-CM | POA: Diagnosis not present

## 2018-06-21 DIAGNOSIS — M79622 Pain in left upper arm: Secondary | ICD-10-CM | POA: Diagnosis present

## 2018-06-21 DIAGNOSIS — J45909 Unspecified asthma, uncomplicated: Secondary | ICD-10-CM | POA: Insufficient documentation

## 2018-06-21 LAB — BASIC METABOLIC PANEL
Anion gap: 8 (ref 5–15)
BUN: 8 mg/dL (ref 6–20)
CALCIUM: 9.7 mg/dL (ref 8.9–10.3)
CO2: 26 mmol/L (ref 22–32)
CREATININE: 0.88 mg/dL (ref 0.61–1.24)
Chloride: 105 mmol/L (ref 98–111)
GFR calc Af Amer: >60 mL/min (ref >60)
Glucose, Bld: 89 mg/dL (ref 70–99)
Potassium: 3.9 mmol/L (ref 3.5–5.1)
SODIUM: 139 mmol/L (ref 135–145)

## 2018-06-21 LAB — CBC
HCT: 47.4 % (ref 39.0–52.0)
Hemoglobin: 14.3 g/dL (ref 13.0–17.0)
MCH: 22.9 pg — ABNORMAL LOW (ref 26.0–34.0)
MCHC: 30.2 g/dL (ref 30.0–36.0)
MCV: 76 fL — ABNORMAL LOW (ref 78.0–100.0)
PLATELETS: 232 10*3/uL (ref 150–400)
RBC: 6.24 MIL/uL — ABNORMAL HIGH (ref 4.22–5.81)
RDW: 15.5 % (ref 11.5–15.5)
WBC: 6.2 10*3/uL (ref 4.0–10.5)

## 2018-06-21 LAB — I-STAT TROPONIN, ED: TROPONIN I, POC: 0.03 ng/mL (ref 0.00–0.08)

## 2018-06-21 MED ORDER — IBUPROFEN 800 MG PO TABS
800.0000 mg | ORAL_TABLET | Freq: Once | ORAL | Status: AC
  Administered 2018-06-21: 800 mg via ORAL
  Filled 2018-06-21: qty 1

## 2018-06-21 MED ORDER — ACETAMINOPHEN 500 MG PO TABS
1000.0000 mg | ORAL_TABLET | Freq: Once | ORAL | Status: DC
  Filled 2018-06-21: qty 2

## 2018-06-21 MED ORDER — IBUPROFEN 800 MG PO TABS: 800.0000 mg | ORAL_TABLET | Freq: Three times a day (TID) | ORAL | 0 refills | Status: DC

## 2018-06-21 NOTE — Discharge Instructions (Addendum)
Thank you for allowing me to care for you today in the Emergency Department.   Wear the sling as needed over the next few days to help with your pain.  Try not to wear it for more than a week as you do not get stiffness in her left upper arm.  Take 800 mg of ibuprofen with food or at thousand milligrams of Tylenol every 8 hours for pain control.  Do not take more than 4000 mg of Tylenol in a 24-hour.  Because it can hurt your liver.  If you continue to have pain, there is some topical over-the-counter creams that you comply directly to the skin, such as capsaicin or lidocaine cream.  Apply ice or heat for 15 to 20 minutes up to 3-4 times a day to help with your symptoms.  Return to the emergency department if you develop new or worsening symptoms including severe shortness of breath, high fever, neck pain, or other new, concerning symptoms.
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
## 2018-06-28 ENCOUNTER — Other Ambulatory Visit: Payer: Self-pay

## 2018-06-28 ENCOUNTER — Emergency Department: Payer: Medicare Other

## 2018-06-28 ENCOUNTER — Emergency Department
Admission: EM | Admit: 2018-06-28 | Discharge: 2018-06-28 | Disposition: A | Discharge status: Home / Self Care | Payer: Medicare Other | Attending: Emergency Medicine | Admitting: Emergency Medicine

## 2018-06-28 ENCOUNTER — Encounter (HOSPITAL_COMMUNITY): Payer: Self-pay | Admitting: Emergency Medicine

## 2018-06-28 ENCOUNTER — Emergency Department (HOSPITAL_COMMUNITY)
Admission: EM | Admit: 2018-06-28 | Discharge: 2018-06-28 | Disposition: A | Discharge status: Home / Self Care | Payer: Medicare Other | Attending: Emergency Medicine | Admitting: Emergency Medicine

## 2018-06-28 ENCOUNTER — Emergency Department (HOSPITAL_COMMUNITY)
Admission: EM | Admit: 2018-06-28 | Discharge: 2018-06-28 | Discharge status: Against Medical Advice | Payer: Medicare Other | Source: Home / Self Care

## 2018-06-28 ENCOUNTER — Encounter: Payer: Self-pay | Admitting: Emergency Medicine

## 2018-06-28 DIAGNOSIS — Z79899 Other long term (current) drug therapy: Secondary | ICD-10-CM | POA: Diagnosis not present

## 2018-06-28 DIAGNOSIS — M79602 Pain in left arm: Secondary | ICD-10-CM | POA: Diagnosis present

## 2018-06-28 DIAGNOSIS — E119 Type 2 diabetes mellitus without complications: Secondary | ICD-10-CM | POA: Diagnosis not present

## 2018-06-28 DIAGNOSIS — R0789 Other chest pain: Secondary | ICD-10-CM | POA: Insufficient documentation

## 2018-06-28 DIAGNOSIS — J45909 Unspecified asthma, uncomplicated: Secondary | ICD-10-CM | POA: Diagnosis not present

## 2018-06-28 DIAGNOSIS — F17210 Nicotine dependence, cigarettes, uncomplicated: Secondary | ICD-10-CM | POA: Insufficient documentation

## 2018-06-28 DIAGNOSIS — Z5321 Procedure and treatment not carried out due to patient leaving prior to being seen by health care provider: Secondary | ICD-10-CM | POA: Diagnosis not present
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
## 2018-06-28 NOTE — ED Notes (Signed)
Patient refusing blood draw in triage.  States "I'm already feeling weak since they drew all that blood last time".  Multiple attempts made to explain reason for blood draw, patient refuses.  Lorin PicketScott, NT witness to conversation with patient.

## 2018-06-28 NOTE — ED Provider Notes (Signed)
Patient left without being seen or evaluated AGAINST MEDICAL ADVICE.   Hunter Monroe, Ivanell Deshotel A, PA-C 06/28/18 1645    Bethann BerkshireZammit, Joseph, MD 06/29/18 [phone]

## 2018-06-28 NOTE — ED Notes (Signed)
Pt stated going to [facility], this NT tried persuading pt to stay letting pt know he had a bed in the ED. Pt left.
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
## 2018-06-28 NOTE — ED Notes (Signed)
Pt refusing monitor and blood draw at this time. EDP aware.

## 2018-06-28 NOTE — ED Notes (Addendum)
Pt out to hall, states he would like to be discharged because "this wait is entirely too long".  This RN went in to APP and made him aware of patient's request.  APP asked this RN to discharge patient AMA.  Patient refused signature.

## 2018-06-28 NOTE — ED Notes (Signed)
Pt still refusing any blood draws. Explained the lab importance about the heart and EKG changes, but does not want any blood drawn.
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
## 2018-07-30 ENCOUNTER — Encounter (HOSPITAL_COMMUNITY): Payer: Self-pay | Admitting: *Deleted

## 2018-07-30 ENCOUNTER — Other Ambulatory Visit: Payer: Self-pay

## 2018-07-30 ENCOUNTER — Emergency Department (HOSPITAL_COMMUNITY): Payer: Medicare Other

## 2018-07-30 ENCOUNTER — Emergency Department: Payer: Medicare Other

## 2018-07-30 ENCOUNTER — Emergency Department
Admission: EM | Admit: 2018-07-30 | Discharge: 2018-07-30 | Disposition: A | Discharge status: Home / Self Care | Payer: Medicare Other | Attending: Emergency Medicine | Admitting: Emergency Medicine

## 2018-07-30 ENCOUNTER — Emergency Department (HOSPITAL_COMMUNITY)
Admission: EM | Admit: 2018-07-30 | Discharge: 2018-07-30 | Disposition: A | Discharge status: Home / Self Care | Payer: Medicare Other | Attending: Emergency Medicine | Admitting: Emergency Medicine

## 2018-07-30 DIAGNOSIS — F17210 Nicotine dependence, cigarettes, uncomplicated: Secondary | ICD-10-CM | POA: Insufficient documentation

## 2018-07-30 DIAGNOSIS — J4521 Mild intermittent asthma with (acute) exacerbation: Secondary | ICD-10-CM | POA: Insufficient documentation

## 2018-07-30 DIAGNOSIS — E119 Type 2 diabetes mellitus without complications: Secondary | ICD-10-CM | POA: Insufficient documentation

## 2018-07-30 DIAGNOSIS — R05 Cough: Secondary | ICD-10-CM | POA: Insufficient documentation

## 2018-07-30 DIAGNOSIS — R0602 Shortness of breath: Secondary | ICD-10-CM | POA: Insufficient documentation

## 2018-07-30 DIAGNOSIS — Z79899 Other long term (current) drug therapy: Secondary | ICD-10-CM | POA: Insufficient documentation

## 2018-07-30 DIAGNOSIS — J45909 Unspecified asthma, uncomplicated: Secondary | ICD-10-CM | POA: Diagnosis not present

## 2018-07-30 DIAGNOSIS — R079 Chest pain, unspecified: Secondary | ICD-10-CM | POA: Diagnosis present

## 2018-07-30 DIAGNOSIS — R059 Cough, unspecified: Secondary | ICD-10-CM

## 2018-07-30 LAB — CBC
HCT: 44.1 % (ref 40.0–52.0)
Hemoglobin: 14.6 g/dL (ref 13.0–18.0)
MCH: 25.1 pg — ABNORMAL LOW (ref 26.0–34.0)
MCHC: 33 g/dL (ref 32.0–36.0)
MCV: 76 fL — ABNORMAL LOW (ref 80.0–100.0)
PLATELETS: 192 10*3/uL (ref 150–440)
RBC: 5.8 MIL/uL (ref 4.40–5.90)
RDW: 14.4 % (ref 11.5–14.5)
WBC: 7.2 10*3/uL (ref 3.8–10.6)

## 2018-07-30 LAB — COMPREHENSIVE METABOLIC PANEL
ALT: 15 U/L (ref 0–44)
AST: 30 U/L (ref 15–41)
Albumin: 4.3 g/dL (ref 3.5–5.0)
Alkaline Phosphatase: 55 U/L (ref 38–126)
Anion gap: 12 (ref 5–15)
BUN: 10 mg/dL (ref 6–20)
CO2: 25 mmol/L (ref 22–32)
Calcium: 9.8 mg/dL (ref 8.9–10.3)
Chloride: 102 mmol/L (ref 98–111)
Creatinine, Ser: 0.83 mg/dL (ref 0.61–1.24)
GFR calc Af Amer: >60 mL/min (ref >60)
GFR calc non Af Amer: >60 mL/min (ref >60)
GLUCOSE: 132 mg/dL — AB (ref 70–99)
POTASSIUM: 3.9 mmol/L (ref 3.5–5.1)
SODIUM: 139 mmol/L (ref 135–145)
Total Bilirubin: 0.6 mg/dL (ref 0.3–1.2)
Total Protein: 8.2 g/dL — ABNORMAL HIGH (ref 6.5–8.1)

## 2018-07-30 LAB — TROPONIN I: Troponin I: <0.03 ng/mL (ref <0.03)

## 2018-07-30 MED ORDER — ALBUTEROL SULFATE HFA 108 (90 BASE) MCG/ACT IN AERS
2.0000 | INHALATION_SPRAY | Freq: Once | RESPIRATORY_TRACT | Status: AC
  Administered 2018-07-30: 2 via RESPIRATORY_TRACT
  Filled 2018-07-30: qty 6.7

## 2018-07-30 MED ORDER — PREDNISONE 20 MG PO TABS: ORAL_TABLET | ORAL | 0 refills | Status: DC

## 2018-07-30 MED ORDER — PREDNISONE 20 MG PO TABS
60.0000 mg | ORAL_TABLET | Freq: Once | ORAL | Status: AC
  Administered 2018-07-30: 60 mg via ORAL
  Filled 2018-07-30: qty 3

## 2018-07-30 MED ORDER — ALBUTEROL SULFATE HFA 108 (90 BASE) MCG/ACT IN AERS: 2.0000 | INHALATION_SPRAY | Freq: Four times a day (QID) | RESPIRATORY_TRACT | 2 refills | Status: AC | PRN

## 2018-07-30 MED ORDER — ALBUTEROL SULFATE HFA 108 (90 BASE) MCG/ACT IN AERS: 2.0000 | INHALATION_SPRAY | Freq: Four times a day (QID) | RESPIRATORY_TRACT | 2 refills | Status: DC | PRN
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
## 2018-07-30 NOTE — Discharge Instructions (Signed)
Take the medications as prescribed, return to the emergency room for any new or worrisome symptoms including shortness of breath, or chest pain, we expect your symptoms should gradually get better as time goes by.  If they do not or you have other concerns return to the ER and follow closely with primary care doctor tomorrow
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
## 2018-07-30 NOTE — Discharge Instructions (Signed)
Take prednisone as prescribed.   Take albuterol every 4 hrs as needed for cough   See your doctor  Return to ER if you have shortness of breath, cough, trouble breathing, fever.
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
## 2018-08-08 ENCOUNTER — Emergency Department
Admission: EM | Admit: 2018-08-08 | Discharge: 2018-08-09 | Disposition: A | Discharge status: Home / Self Care | Payer: Medicare Other | Attending: Emergency Medicine | Admitting: Emergency Medicine

## 2018-08-08 ENCOUNTER — Encounter: Payer: Self-pay | Admitting: Emergency Medicine

## 2018-08-08 ENCOUNTER — Other Ambulatory Visit: Payer: Self-pay

## 2018-08-08 DIAGNOSIS — E119 Type 2 diabetes mellitus without complications: Secondary | ICD-10-CM | POA: Insufficient documentation

## 2018-08-08 DIAGNOSIS — F3132 Bipolar disorder, current episode depressed, moderate: Secondary | ICD-10-CM | POA: Diagnosis present

## 2018-08-08 DIAGNOSIS — F17210 Nicotine dependence, cigarettes, uncomplicated: Secondary | ICD-10-CM | POA: Insufficient documentation

## 2018-08-08 DIAGNOSIS — J45909 Unspecified asthma, uncomplicated: Secondary | ICD-10-CM | POA: Diagnosis not present

## 2018-08-08 DIAGNOSIS — R45851 Suicidal ideations: Secondary | ICD-10-CM | POA: Insufficient documentation

## 2018-08-08 DIAGNOSIS — Z79899 Other long term (current) drug therapy: Secondary | ICD-10-CM | POA: Insufficient documentation

## 2018-08-08 LAB — CBC
HEMATOCRIT: 46.1 % (ref 39.0–52.0)
Hemoglobin: 14.6 g/dL (ref 13.0–17.0)
MCH: 23.4 pg — ABNORMAL LOW (ref 26.0–34.0)
MCHC: 31.7 g/dL (ref 30.0–36.0)
MCV: 73.9 fL — AB (ref 80.0–100.0)
NRBC: 0 % (ref 0.0–0.2)
PLATELETS: 231 10*3/uL (ref 150–400)
RBC: 6.24 MIL/uL — ABNORMAL HIGH (ref 4.22–5.81)
RDW: 14.7 % (ref 11.5–15.5)
WBC: 6 10*3/uL (ref 4.0–10.5)

## 2018-08-08 LAB — COMPREHENSIVE METABOLIC PANEL
ALT: 14 U/L (ref 0–44)
AST: 14 U/L — AB (ref 15–41)
Albumin: 4 g/dL (ref 3.5–5.0)
Alkaline Phosphatase: 48 U/L (ref 38–126)
Anion gap: 7 (ref 5–15)
BILIRUBIN TOTAL: 0.4 mg/dL (ref 0.3–1.2)
BUN: 8 mg/dL (ref 6–20)
CHLORIDE: 105 mmol/L (ref 98–111)
CO2: 27 mmol/L (ref 22–32)
CREATININE: 0.88 mg/dL (ref 0.61–1.24)
Calcium: 9.3 mg/dL (ref 8.9–10.3)
Glucose, Bld: 96 mg/dL (ref 70–99)
POTASSIUM: 3.6 mmol/L (ref 3.5–5.1)
Sodium: 139 mmol/L (ref 135–145)
TOTAL PROTEIN: 7.2 g/dL (ref 6.5–8.1)

## 2018-08-08 LAB — SALICYLATE LEVEL

## 2018-08-08 LAB — ETHANOL

## 2018-08-08 LAB — ACETAMINOPHEN LEVEL: Acetaminophen (Tylenol), Serum: <10 ug/mL — ABNORMAL LOW (ref 10–30)

## 2018-08-08 NOTE — ED Provider Notes (Signed)
[facility] Emergency Department Provider Note ____________________________________________   First MD Initiated Contact with Patient 08/08/18 1912     (approximate)  I have reviewed the triage vital signs and the nursing notes.   HISTORY  Chief Complaint Suicidal    HPI Hunter Monroe is a 47 y.o. male with PMH as noted below who presents with suicidal ideation.  He states he has been increasingly depressed over the last week and has considered cutting himself with a razor although he has not tried to do so.  He does say that he had one prior suicidal attempt in prison one year ago during which she tried to hang himself.  Patient states he is on multiple medications and is compliant.  He also reports a history of asthma but denies any acute medical symptoms.  Past Medical History:  Diagnosis Date  . Anxiety   . Asthma   . Bipolar 1 disorder (HCC)   . Depression   . Diabetes mellitus without complication (HCC)   . Homelessness   . Schizophrenia (HCC)     There are no active problems to display for this patient.   History reviewed. No pertinent surgical history.  Prior to Admission medications   Medication Sig Start Date End Date Taking? Authorizing Provider  albuterol (PROVENTIL HFA;VENTOLIN HFA) 108 (90 Base) MCG/ACT inhaler Inhale 2 puffs into the lungs every 6 (six) hours as needed for wheezing or shortness of breath. 07/30/18  Yes Jeanmarie Plant, MD  buPROPion (WELLBUTRIN XL) 300 MG 24 hr tablet Take 300 mg by mouth daily.   Yes [provider]  hydrOXYzine (ATARAX/VISTARIL) 50 MG tablet Take 1 tablet by mouth 4 (four) times daily as needed for anxiety.  12/17/17  Yes [provider]  ibuprofen (ADVIL,MOTRIN) 800 MG tablet Take 1 tablet (800 mg total) by mouth 3 (three) times daily. 06/21/18  Yes McDonald, Mia A, PA-C  paliperidone (INVEGA) 9 MG 24 hr tablet Take 9 mg by mouth every morning.   Yes [provider]    QUEtiapine (SEROQUEL) 400 MG tablet Take 1 tablet by mouth at bedtime. 12/17/17  Yes [provider]  traZODone (DESYREL) 100 MG tablet Take 200 mg by mouth at bedtime. 12/17/17  Yes [provider]  triamcinolone ointment (KENALOG) 0.1 % Apply 1 application topically 2 (two) times daily. 03/16/18  Yes [name], NP  naproxen (NAPROSYN) 500 MG tablet Take 1 tablet (500 mg total) by mouth 2 (two) times daily. Patient not taking: Reported on 06/21/2018 03/29/18   [name], [name] [name], PA-C  predniSONE (DELTASONE) 20 MG tablet Take 60 mg daily x 2 days then 40 mg daily x 2 days then 20 mg daily x 2 days Patient not taking: Reported on 08/08/2018 07/30/18   Jeanmarie Plant, MD    Allergies Naproxen  No family history on file.  Social History Social History   Tobacco Use  . Smoking status: Current Every Day Smoker    Packs/day: 0.25    Types: Cigarettes  . Smokeless tobacco: Never Used  Substance Use Topics  . Alcohol use: Not Currently  . Drug use: No    Review of Systems  Constitutional: No fever. Eyes: No redness. ENT: No sore throat. Cardiovascular: Denies chest pain. Respiratory: Positive for intermittent shortness of breath. Gastrointestinal: No vomiting. Genitourinary: Negative for dysuria.  Musculoskeletal: Negative for back pain. Skin: Negative for rash. Neurological: Negative for headache.   ____________________________________________   PHYSICAL EXAM:  VITAL SIGNS:  ED Triage Vitals  Enc Vitals Group     BP 08/08/18 1822 (!) 124/91     Pulse Rate 08/08/18 1822 94     Resp 08/08/18 1822 18     Temp 08/08/18 1822 (!) 97.4 F (36.3 C)     Temp Source 08/08/18 1822 Oral     SpO2 08/08/18 1822 98 %     Weight 08/08/18 1820 140 lb (63.5 kg)     Height 08/08/18 1820 5\' 7"  (1.702 m)     Head Circumference --      Peak Flow --      Pain Score 08/08/18 1821 0     Pain Loc --      Pain Edu? --      Excl. in GC? --     Constitutional:  Alert and oriented. Well appearing and in no acute distress. Eyes: Conjunctivae are normal.  Head: Atraumatic. Nose: No congestion/rhinnorhea. Mouth/Throat: Mucous membranes are moist.   Neck: Normal range of motion.  Cardiovascular: Good peripheral circulation. Respiratory: Normal respiratory effort.  No retractions. Lungs CTAB. Gastrointestinal:  No distention.  Musculoskeletal: Extremities warm and well perfused.  Neurologic:  Normal speech and language. No gross focal neurologic deficits are appreciated.  Skin:  Skin is warm and dry. No rash noted. Psychiatric: Flat affect.  Calm and cooperative.  ____________________________________________   LABS (all labs ordered are listed, but only abnormal results are displayed)  Labs Reviewed  COMPREHENSIVE METABOLIC PANEL - Abnormal; Notable for the following components:      Result Value   AST 14 (*)    All other components within normal limits  ACETAMINOPHEN LEVEL - Abnormal; Notable for the following components:   Acetaminophen (Tylenol), Serum <10 (*)    All other components within normal limits  CBC - Abnormal; Notable for the following components:   RBC 6.24 (*)    MCV 73.9 (*)    MCH 23.4 (*)    All other components within normal limits  ETHANOL  SALICYLATE LEVEL  URINE DRUG SCREEN, QUALITATIVE ([facility] ONLY)   ____________________________________________  EKG   ____________________________________________  RADIOLOGY    ____________________________________________   PROCEDURES  Procedure(s) performed: No  Procedures  Critical Care performed: No ____________________________________________   INITIAL IMPRESSION / ASSESSMENT AND PLAN / ED COURSE  Pertinent labs & imaging results that were available during my care of the patient were reviewed by me and considered in my medical decision making (see chart for details).  47 year old male with PMH as noted above presents with worsening depression and suicidal  ideation with a plan to possibly cut himself.  He states he is compliant with his medications.  He has no acute medical complaints.  His vital signs are normal and the physical exam is unremarkable.  We will obtain labs for medical clearance, [facility] telepsychiatry consultation, and disposition per psychiatry recommendations.  ----------------------------------------- 11:44 PM on 08/08/2018 -----------------------------------------  The patient initially declined lab work-up because he is afraid of needles.  However he finally agreed and his lab work-up is unremarkable.  He is medically cleared.  He is awaiting SOC evaluation.  I am signing the patient out to the oncoming physician Dr. Dolores Frame. ____________________________________________   FINAL CLINICAL IMPRESSION(S) / ED DIAGNOSES  Final diagnoses:  Suicidal ideation      NEW MEDICATIONS STARTED DURING THIS VISIT:  New Prescriptions   No medications on file     Note:  This document was prepared using Dragon voice recognition software and may include unintentional dictation  errors.    Dionne Bucy, MD 08/08/18 [phone]

## 2018-08-08 NOTE — ED Notes (Signed)
SOC called report given.  Pt. Moved to interview room with security escort.

## 2018-08-08 NOTE — ED Notes (Signed)
Patient refusing to have blood work drawn.  Patient adamant and repeating himself.  Patient reports fear of needles.

## 2018-08-08 NOTE — ED Notes (Signed)
Pt loud and cursing staff.  Stating, "I don't want my blood drawn!  That's it!"

## 2018-08-08 NOTE — ED Notes (Signed)
Pt transported to interview room for consult with MD by RN Susy Frizzle

## 2018-08-08 NOTE — ED Notes (Signed)
Pt refuses to have his blood drawn.  States, "You can't make me have my blood drawn.  Case closed!"

## 2018-08-08 NOTE — ED Notes (Signed)
Spoke with patient with ER doctor.  Pt. States feeling suicidal for the past week.  Pt. States he planned to take razor to wrists.  Pt. Denies attempting.  Pt. States the last time he had suicidal thoughts was a year ago while in prison, pt. States hanging attempt.  Pt. States he has been compliant with medications.  Pt. States consuming ETOH today.

## 2018-08-08 NOTE — ED Notes (Signed)
Spoke with patient at length about the need for blood specimen.  Pt. addiment about not giving blood.  Pt. States "I no longer feel suicidal, I think I felt that way because I drank alcohol with my medications today".

## 2018-08-08 NOTE — ED Notes (Signed)
Pt. Completed SOC, [facility] doctor recommends discharge.  SOC stated he would rescind IVC paperwork.

## 2018-08-08 NOTE — ED Triage Notes (Signed)
Patient presents to the ED and states he is feeling suicidal x 1 week.  Patient reports hearing command hallucinations.  Patient reports buying razors with a plan to slit his wrists.  Patient states he is homeless.  Patient states he stays in his car.

## 2018-08-08 NOTE — ED Notes (Signed)
Patient belongings:  Hunter Monroe back pack, grey pants, blue shirt.

## 2018-08-09 NOTE — ED Notes (Signed)
Pt. Discharged IVC reciended.

## 2018-08-09 NOTE — Discharge Instructions (Addendum)
Continue all medications as directed by your doctor.  Return to the ER for worsening symptoms, feelings of hurting yourself or others, or other concerns. °

## 2018-08-09 NOTE — ED Provider Notes (Signed)
-----------------------------------------   12:33 AM on 08/09/2018 -----------------------------------------  Patient was evaluated by [facility] psychiatrist Dr. Reece Leader who deems him psychiatrically stable for discharge home and will rescind his IVC.  Return precautions given.  Patient verbalizes understanding and agrees with plan of care.   Irean Hong, MD 08/09/18 [phone]

## 2019-04-15 ENCOUNTER — Other Ambulatory Visit: Payer: Self-pay

## 2019-04-15 ENCOUNTER — Emergency Department (HOSPITAL_COMMUNITY)
Admission: EM | Admit: 2019-04-15 | Discharge: 2019-04-15 | Discharge status: Against Medical Advice | Payer: Medicare Other | Attending: Emergency Medicine | Admitting: Emergency Medicine

## 2019-04-15 ENCOUNTER — Emergency Department
Admission: EM | Admit: 2019-04-15 | Discharge: 2019-04-15 | Disposition: A | Discharge status: Home / Self Care | Payer: Medicare Other | Attending: Emergency Medicine | Admitting: Emergency Medicine

## 2019-04-15 ENCOUNTER — Encounter: Payer: Self-pay | Admitting: Emergency Medicine

## 2019-04-15 ENCOUNTER — Encounter (HOSPITAL_COMMUNITY): Payer: Self-pay

## 2019-04-15 ENCOUNTER — Emergency Department (HOSPITAL_COMMUNITY): Payer: Medicare Other

## 2019-04-15 DIAGNOSIS — J45909 Unspecified asthma, uncomplicated: Secondary | ICD-10-CM | POA: Diagnosis not present

## 2019-04-15 DIAGNOSIS — E119 Type 2 diabetes mellitus without complications: Secondary | ICD-10-CM | POA: Insufficient documentation

## 2019-04-15 DIAGNOSIS — F0781 Postconcussional syndrome: Secondary | ICD-10-CM | POA: Insufficient documentation

## 2019-04-15 DIAGNOSIS — R51 Headache: Secondary | ICD-10-CM | POA: Insufficient documentation

## 2019-04-15 DIAGNOSIS — R079 Chest pain, unspecified: Secondary | ICD-10-CM | POA: Insufficient documentation

## 2019-04-15 DIAGNOSIS — F17210 Nicotine dependence, cigarettes, uncomplicated: Secondary | ICD-10-CM | POA: Insufficient documentation

## 2019-04-15 DIAGNOSIS — Z590 Homelessness: Secondary | ICD-10-CM | POA: Insufficient documentation

## 2019-04-15 DIAGNOSIS — F209 Schizophrenia, unspecified: Secondary | ICD-10-CM | POA: Insufficient documentation

## 2019-04-15 DIAGNOSIS — Z5321 Procedure and treatment not carried out due to patient leaving prior to being seen by health care provider: Secondary | ICD-10-CM | POA: Insufficient documentation

## 2019-04-15 DIAGNOSIS — R0789 Other chest pain: Secondary | ICD-10-CM | POA: Diagnosis not present

## 2019-04-15 DIAGNOSIS — Z79899 Other long term (current) drug therapy: Secondary | ICD-10-CM | POA: Insufficient documentation

## 2019-04-15 LAB — CBC WITH DIFFERENTIAL/PLATELET
Abs Immature Granulocytes: 0.01 10*3/uL (ref 0.00–0.07)
Basophils Absolute: 0 10*3/uL (ref 0.0–0.1)
Basophils Relative: 1 %
Eosinophils Absolute: 0.1 10*3/uL (ref 0.0–0.5)
Eosinophils Relative: 3 %
HCT: 48.8 % (ref 39.0–52.0)
Hemoglobin: 15.1 g/dL (ref 13.0–17.0)
Immature Granulocytes: 0 %
Lymphocytes Relative: 42 %
Lymphs Abs: 2.3 10*3/uL (ref 0.7–4.0)
MCH: 23.6 pg — ABNORMAL LOW (ref 26.0–34.0)
MCHC: 30.9 g/dL (ref 30.0–36.0)
MCV: 76.1 fL — ABNORMAL LOW (ref 80.0–100.0)
Monocytes Absolute: 0.5 10*3/uL (ref 0.1–1.0)
Monocytes Relative: 8 %
Neutro Abs: 2.5 10*3/uL (ref 1.7–7.7)
Neutrophils Relative %: 46 %
Platelets: 239 10*3/uL (ref 150–400)
RBC: 6.41 MIL/uL — ABNORMAL HIGH (ref 4.22–5.81)
RDW: 16.8 % — ABNORMAL HIGH (ref 11.5–15.5)
WBC: 5.5 10*3/uL (ref 4.0–10.5)
nRBC: 0 % (ref 0.0–0.2)

## 2019-04-15 LAB — COMPREHENSIVE METABOLIC PANEL
ALT: 14 U/L (ref 0–44)
AST: 19 U/L (ref 15–41)
Albumin: 4.3 g/dL (ref 3.5–5.0)
Alkaline Phosphatase: 58 U/L (ref 38–126)
Anion gap: 9 (ref 5–15)
BUN: <5 mg/dL — ABNORMAL LOW (ref 6–20)
CO2: 26 mmol/L (ref 22–32)
Calcium: 9.7 mg/dL (ref 8.9–10.3)
Chloride: 106 mmol/L (ref 98–111)
Creatinine, Ser: 0.81 mg/dL (ref 0.61–1.24)
GFR calc Af Amer: >60 mL/min (ref >60)
GFR calc non Af Amer: >60 mL/min (ref >60)
Glucose, Bld: 107 mg/dL — ABNORMAL HIGH (ref 70–99)
Potassium: 4.2 mmol/L (ref 3.5–5.1)
Sodium: 141 mmol/L (ref 135–145)
Total Bilirubin: 0.4 mg/dL (ref 0.3–1.2)
Total Protein: 7.3 g/dL (ref 6.5–8.1)

## 2019-04-15 LAB — TROPONIN I: Troponin I: <0.03 ng/mL (ref <0.03)

## 2019-04-15 MED ORDER — ACETAMINOPHEN 325 MG PO TABS: 650.0000 mg | ORAL_TABLET | Freq: Four times a day (QID) | ORAL | 0 refills | Status: AC | PRN

## 2019-04-15 MED ORDER — METOCLOPRAMIDE HCL 10 MG PO TABS
10.0000 mg | ORAL_TABLET | Freq: Once | ORAL | Status: AC
  Administered 2019-04-15: 10 mg via ORAL
  Filled 2019-04-15: qty 1

## 2019-04-15 MED ORDER — DIPHENHYDRAMINE HCL 25 MG PO CAPS
25.0000 mg | ORAL_CAPSULE | Freq: Once | ORAL | Status: AC
  Administered 2019-04-15: 25 mg via ORAL
  Filled 2019-04-15: qty 1
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
## 2019-04-15 NOTE — ED Notes (Signed)
Pt stated he has to leave and can not wait to be d/c'd, PA [facility] aware pt is leaving AMA.

## 2019-04-15 NOTE — ED Notes (Addendum)
Pt refusing further blood draws. PA [place] notified.

## 2019-04-15 NOTE — ED Notes (Signed)
Pt denied blood draw for Troponin states "should have gotten the blood the first time, isn't giving anymore blood also needs to take daughter home. If results aren't back in 10 minutes he's leaving" notified Johana(PA)
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
## 2019-04-15 NOTE — ED Notes (Signed)
Patient transported to X-ray 
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
## 2019-04-15 NOTE — ED Notes (Signed)
Pt leaving AMA notified Jeff(PA).

## 2019-04-15 NOTE — ED Notes (Signed)
Pt states he is going to leave and go to urgent care in the morning. Pt does not want to be seen in this ED anymore because he wants to go to bed and had things to do in the morning.

## 2019-04-15 NOTE — ED Notes (Signed)
Pt is refusing to have any bloodwork done "due to the covid-19", pt gave slight attitude when asked what they meant

## 2019-04-15 NOTE — Discharge Instructions (Addendum)
Your laboratory

## 2019-04-16 ENCOUNTER — Other Ambulatory Visit: Payer: Self-pay

## 2019-04-16 ENCOUNTER — Encounter: Payer: Self-pay | Admitting: Emergency Medicine

## 2019-04-16 ENCOUNTER — Emergency Department
Admission: EM | Admit: 2019-04-16 | Discharge: 2019-04-16 | Disposition: A | Discharge status: Home / Self Care | Payer: Medicare Other | Source: Home / Self Care | Attending: Emergency Medicine | Admitting: Emergency Medicine

## 2019-04-16 ENCOUNTER — Emergency Department: Payer: Medicare Other

## 2019-04-16 DIAGNOSIS — F0781 Postconcussional syndrome: Secondary | ICD-10-CM

## 2019-04-16 DIAGNOSIS — R079 Chest pain, unspecified: Secondary | ICD-10-CM | POA: Diagnosis not present
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
## 2019-04-16 NOTE — ED Notes (Signed)
Pateint AAOx4. Vitals stable. NAD
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
## 2019-06-22 ENCOUNTER — Encounter: Payer: Self-pay | Admitting: Emergency Medicine

## 2019-06-22 ENCOUNTER — Emergency Department
Admission: EM | Admit: 2019-06-22 | Discharge: 2019-06-22 | Disposition: A | Discharge status: Home / Self Care | Payer: Medicare Other | Attending: Emergency Medicine | Admitting: Emergency Medicine

## 2019-06-22 ENCOUNTER — Other Ambulatory Visit: Payer: Self-pay

## 2019-06-22 DIAGNOSIS — L02416 Cutaneous abscess of left lower limb: Secondary | ICD-10-CM

## 2019-06-22 DIAGNOSIS — F17210 Nicotine dependence, cigarettes, uncomplicated: Secondary | ICD-10-CM | POA: Diagnosis not present

## 2019-06-22 DIAGNOSIS — L739 Follicular disorder, unspecified: Secondary | ICD-10-CM | POA: Insufficient documentation

## 2019-06-22 DIAGNOSIS — L02411 Cutaneous abscess of right axilla: Secondary | ICD-10-CM | POA: Diagnosis present

## 2019-06-22 DIAGNOSIS — J45909 Unspecified asthma, uncomplicated: Secondary | ICD-10-CM | POA: Insufficient documentation

## 2019-06-22 DIAGNOSIS — E119 Type 2 diabetes mellitus without complications: Secondary | ICD-10-CM | POA: Insufficient documentation

## 2019-06-22 DIAGNOSIS — Z79899 Other long term (current) drug therapy: Secondary | ICD-10-CM | POA: Insufficient documentation

## 2019-06-22 DIAGNOSIS — Z590 Homelessness: Secondary | ICD-10-CM | POA: Diagnosis not present

## 2019-06-22 DIAGNOSIS — M65052 Abscess of tendon sheath, left thigh: Secondary | ICD-10-CM | POA: Insufficient documentation

## 2019-06-22 MED ORDER — DOXYCYCLINE HYCLATE 100 MG PO CAPS: 100.0000 mg | ORAL_CAPSULE | Freq: Two times a day (BID) | ORAL | 0 refills | Status: AC

## 2019-06-22 NOTE — ED Notes (Signed)
Resting on stretcher awaiting MD, NAD. Shows 3 small raised areas, states present x 3 weeks.

## 2019-06-22 NOTE — ED Triage Notes (Signed)
Pt ambulatory to triage with no difficulty. Pt reports abscess under his right arm. No drainage.

## 2019-06-22 NOTE — ED Provider Notes (Signed)
[facility] Emergency Department Provider Note   ____________________________________________   First MD Initiated Contact with Patient 06/22/19 [phone]     (approximate)  I have reviewed the triage vital signs and the nursing notes.   HISTORY  Chief Complaint Abscess    HPI Hunter Monroe is a 48 y.o. male with past medical history of bipolar disorder, schizophrenia, diabetes who presents to the ED complaining of abscesses.  Patient reports a couple weeks of noticing painful swollen areas to his right axilla, left thigh, and pubic area.  They are not currently draining anything, but he states his left thigh was draining last week.  He denies any fevers or spreading redness, also has not had any chest pain, cough, or shortness of breath.  He is additionally concerned because of recent unprotected sex, denies any dysuria, hematuria, or penile discharge, but wishes to be evaluated for STD.        Past Medical History:  Diagnosis Date  . Anxiety   . Asthma   . Bipolar 1 disorder ([place])   . Depression   . Diabetes mellitus without complication ([place])   . Homelessness   . Schizophrenia ([place])     There are no active problems to display for this patient.   History reviewed. No pertinent surgical history.  Prior to Admission medications   Medication Sig Start Date End Date Taking? Authorizing Provider  albuterol (PROVENTIL HFA;VENTOLIN HFA) 108 (90 Base) MCG/ACT inhaler Inhale 2 puffs into the lungs every 6 (six) hours as needed for wheezing or shortness of breath. 07/30/18   Schuyler Amor, MD  buPROPion (WELLBUTRIN XL) 300 MG 24 hr tablet Take 300 mg by mouth daily.    [provider]  doxycycline (VIBRAMYCIN) 100 MG capsule Take 1 capsule (100 mg total) by mouth 2 (two) times daily for 7 days. 06/22/19 06/29/19  Blake Divine, MD  hydrOXYzine (ATARAX/VISTARIL) 50 MG tablet Take 1 tablet by mouth 4 (four) times daily as needed for anxiety.   12/17/17   [provider]  ibuprofen (ADVIL,MOTRIN) 800 MG tablet Take 1 tablet (800 mg total) by mouth 3 (three) times daily. 06/21/18   McDonald, Mia A, PA-C  naproxen (NAPROSYN) 500 MG tablet Take 1 tablet (500 mg total) by mouth 2 (two) times daily. Patient not taking: Reported on 06/21/2018 03/29/18   [name], Glynda Jaeger, PA-C  paliperidone (INVEGA) 9 MG 24 hr tablet Take 9 mg by mouth every morning.    [provider]  predniSONE (DELTASONE) 20 MG tablet Take 60 mg daily x 2 days then 40 mg daily x 2 days then 20 mg daily x 2 days Patient not taking: Reported on 08/08/2018 07/30/18   Schuyler Amor, MD  QUEtiapine (SEROQUEL) 400 MG tablet Take 1 tablet by mouth at bedtime. 12/17/17   [provider]  traZODone (DESYREL) 100 MG tablet Take 200 mg by mouth at bedtime. 12/17/17   [provider]  triamcinolone ointment (KENALOG) 0.1 % Apply 1 application topically 2 (two) times daily. 03/16/18   Jearld Fenton, NP    Allergies Naproxen  History reviewed. No pertinent family history.  Social History Social History   Tobacco Use  . Smoking status: Current Every Day Smoker    Packs/day: 1.00    Types: Cigarettes  . Smokeless tobacco: Never Used  Substance Use Topics  . Alcohol use: Not Currently  . Drug use: No    Review of Systems  Constitutional: No fever/chills Eyes: No  visual changes. ENT: No sore throat. Cardiovascular: Denies chest pain. Respiratory: Denies shortness of breath. Gastrointestinal: No abdominal pain.  No nausea, no vomiting.  No diarrhea.  No constipation. Genitourinary: Negative for dysuria. Musculoskeletal: Negative for back pain. Skin: Negative for rash.  Positive for skin lesions. Neurological: Negative for headaches, focal weakness or numbness.  ____________________________________________   PHYSICAL EXAM:  VITAL SIGNS: ED Triage Vitals  Enc Vitals Group     BP 06/22/19 0515 129/70     Pulse Rate 06/22/19  0515 72     Resp 06/22/19 0515 18     Temp 06/22/19 0515 98.1 F (36.7 C)     Temp Source 06/22/19 0515 Oral     SpO2 06/22/19 0515 99 %     Weight 06/22/19 0512 145 lb (65.8 kg)     Height 06/22/19 0512 5\' 7"  (1.702 m)     Head Circumference --      Peak Flow --      Pain Score 06/22/19 0511 6     Pain Loc --      Pain Edu? --      Excl. in GC? --     Constitutional: Alert and oriented. Eyes: Conjunctivae are normal. Head: Atraumatic. Nose: No congestion/rhinnorhea. Mouth/Throat: Mucous membranes are moist. Neck: Normal ROM Cardiovascular: Normal rate, regular rhythm. Grossly normal heart sounds. Respiratory: Normal respiratory effort.  No retractions. Lungs CTAB. Gastrointestinal: Soft and nontender. No distention. Genitourinary: Area of folliculitis to pubic area without evidence of abscess, no testicular tenderness or penile lesions, no groin lymphadenopathy. Musculoskeletal: No lower extremity tenderness nor edema. Neurologic:  Normal speech and language. No gross focal neurologic deficits are appreciated. Skin:  Skin is warm, dry and intact. No rash noted.  Healing abscess to left thigh with no erythema, warmth, or fluctuance.  Developing area of abscess to right axilla with small area of tenderness and fluctuance, no significant erythema. Psychiatric: Mood and affect are normal. Speech and behavior are normal.  ____________________________________________   LABS (all labs ordered are listed, but only abnormal results are displayed)  Labs Reviewed  GC/CHLAMYDIA PROBE AMP    PROCEDURES  Procedure(s) performed (including Critical Care):  Procedures   ____________________________________________   INITIAL IMPRESSION / ASSESSMENT AND PLAN / ED COURSE       48 year old male presents to the ED for evaluation of multiple skin lesions and concern for STD exposure.  Area to his groin appears most consistent with folliculitis, no evidence of other skin lesions to  groin.  Abscess to left thigh is healing and abscess to right axilla still developing, do not feel patient would benefit from I&D at this time.  Counseled patient on warm compresses and will start on doxycycline given concern for MRSA.  Will test for GC/chlamydia, however patient adamantly declines any IM injection for treatment.  Doxycycline will cover for chlamydia, but advised patient that gonorrhea would remain untreated.  Patient expresses understanding, will return to the ED for new or worsening symptoms.      ____________________________________________   FINAL CLINICAL IMPRESSION(S) / ED DIAGNOSES  Final diagnoses:  Abscess of axilla, right  Folliculitis  Abscess of left thigh     ED Discharge Orders         Ordered    doxycycline (VIBRAMYCIN) 100 MG capsule  2 times daily     06/22/19 [id number]           Note:  This document was prepared using Dragon voice recognition software and may include unintentional  dictation errors.   Chesley NoonJessup, Reyanne Hussar, MD 06/22/19 [phone]

## 2019-06-25 LAB — GC/CHLAMYDIA PROBE AMP
Chlamydia trachomatis, NAA: NEGATIVE
Neisseria Gonorrhoeae by PCR: NEGATIVE

## 2019-07-05 ENCOUNTER — Emergency Department
Admission: EM | Admit: 2019-07-05 | Discharge: 2019-07-05 | Discharge status: Against Medical Advice | Payer: Medicare Other

## 2019-07-07 ENCOUNTER — Emergency Department (HOSPITAL_COMMUNITY)
Admission: EM | Admit: 2019-07-07 | Discharge: 2019-07-07 | Discharge status: Against Medical Advice | Payer: Medicare Other | Attending: Emergency Medicine | Admitting: Emergency Medicine

## 2019-07-07 DIAGNOSIS — Z5321 Procedure and treatment not carried out due to patient leaving prior to being seen by health care provider: Secondary | ICD-10-CM | POA: Diagnosis not present

## 2019-07-07 DIAGNOSIS — Z041 Encounter for examination and observation following transport accident: Secondary | ICD-10-CM | POA: Diagnosis present

## 2019-07-07 NOTE — ED Notes (Signed)
Patient inquired about wait time - attempted to explain process to patient and encourage him to wait, but he would not listen and left.
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
## 2019-07-08 ENCOUNTER — Emergency Department: Payer: No Typology Code available for payment source

## 2019-07-08 ENCOUNTER — Encounter: Payer: Self-pay | Admitting: Emergency Medicine

## 2019-07-08 ENCOUNTER — Emergency Department
Admission: EM | Admit: 2019-07-08 | Discharge: 2019-07-08 | Disposition: A | Discharge status: Home / Self Care | Payer: No Typology Code available for payment source | Attending: Student | Admitting: Student

## 2019-07-08 ENCOUNTER — Other Ambulatory Visit: Payer: Self-pay

## 2019-07-08 DIAGNOSIS — Y999 Unspecified external cause status: Secondary | ICD-10-CM | POA: Diagnosis not present

## 2019-07-08 DIAGNOSIS — E119 Type 2 diabetes mellitus without complications: Secondary | ICD-10-CM | POA: Insufficient documentation

## 2019-07-08 DIAGNOSIS — S161XXA Strain of muscle, fascia and tendon at neck level, initial encounter: Secondary | ICD-10-CM | POA: Diagnosis not present

## 2019-07-08 DIAGNOSIS — F17210 Nicotine dependence, cigarettes, uncomplicated: Secondary | ICD-10-CM | POA: Diagnosis not present

## 2019-07-08 DIAGNOSIS — J45909 Unspecified asthma, uncomplicated: Secondary | ICD-10-CM | POA: Insufficient documentation

## 2019-07-08 DIAGNOSIS — S0003XA Contusion of scalp, initial encounter: Secondary | ICD-10-CM | POA: Diagnosis not present

## 2019-07-08 DIAGNOSIS — Z590 Homelessness: Secondary | ICD-10-CM | POA: Diagnosis not present

## 2019-07-08 DIAGNOSIS — S0990XA Unspecified injury of head, initial encounter: Secondary | ICD-10-CM | POA: Diagnosis present

## 2019-07-08 DIAGNOSIS — Y92410 Unspecified street and highway as the place of occurrence of the external cause: Secondary | ICD-10-CM | POA: Diagnosis not present

## 2019-07-08 DIAGNOSIS — Y93I9 Activity, other involving external motion: Secondary | ICD-10-CM | POA: Diagnosis not present

## 2019-07-08 DIAGNOSIS — Z79899 Other long term (current) drug therapy: Secondary | ICD-10-CM | POA: Insufficient documentation

## 2019-07-08 MED ORDER — NABUMETONE 500 MG PO TABS: 500.0000 mg | ORAL_TABLET | Freq: Two times a day (BID) | ORAL | 0 refills | Status: AC
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
## 2019-07-08 NOTE — Discharge Instructions (Signed)
Call make an appointment with your primary care provider if any continued problems or urgent concerns.  A prescription for inflammation pain was sent to your pharmacy.  You may also use ice or heat to your back and neck as needed for discomfort.
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
## 2019-09-11 ENCOUNTER — Ambulatory Visit: Payer: Self-pay | Admitting: Family Medicine
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
## 2019-10-01 ENCOUNTER — Ambulatory Visit: Payer: Self-pay | Admitting: Family Medicine

## 2020-05-22 ENCOUNTER — Emergency Department
Admission: EM | Admit: 2020-05-22 | Discharge: 2020-05-22 | Discharge status: Against Medical Advice | Payer: No Typology Code available for payment source

## 2020-05-22 NOTE — ED Notes (Signed)
Pt reports he needs to get to [facility] so will just wait and be seen there.

## 2020-10-29 ENCOUNTER — Encounter: Payer: Self-pay | Admitting: Emergency Medicine

## 2020-10-29 ENCOUNTER — Emergency Department: Payer: Medicare Other

## 2020-10-29 ENCOUNTER — Emergency Department
Admission: EM | Admit: 2020-10-29 | Discharge: 2020-10-29 | Disposition: A | Discharge status: Home / Self Care | Payer: Medicare Other | Attending: Emergency Medicine | Admitting: Emergency Medicine

## 2020-10-29 DIAGNOSIS — E119 Type 2 diabetes mellitus without complications: Secondary | ICD-10-CM | POA: Insufficient documentation

## 2020-10-29 DIAGNOSIS — F17210 Nicotine dependence, cigarettes, uncomplicated: Secondary | ICD-10-CM | POA: Insufficient documentation

## 2020-10-29 DIAGNOSIS — R29898 Other symptoms and signs involving the musculoskeletal system: Secondary | ICD-10-CM

## 2020-10-29 DIAGNOSIS — M6281 Muscle weakness (generalized): Secondary | ICD-10-CM | POA: Diagnosis not present

## 2020-10-29 DIAGNOSIS — J45909 Unspecified asthma, uncomplicated: Secondary | ICD-10-CM | POA: Diagnosis not present

## 2020-10-29 DIAGNOSIS — R200 Anesthesia of skin: Secondary | ICD-10-CM | POA: Insufficient documentation

## 2020-10-29 DIAGNOSIS — R4781 Slurred speech: Secondary | ICD-10-CM | POA: Diagnosis not present

## 2020-10-29 LAB — CBG MONITORING, ED: Glucose-Capillary: 76 mg/dL (ref 70–99)

## 2020-10-29 NOTE — ED Triage Notes (Signed)
Pt reports he received the J&J vaccination on 12/30 and is concerned due to fingers numb and tingling on the right side where shot was delivered. Pt has no neck pain, no pain anywhere, no drift, can grip with both hands, no weakness reported.

## 2020-10-29 NOTE — ED Notes (Signed)
CBG 76. Patient given Orange juice, crackers, and peanut butter.

## 2020-10-29 NOTE — ED Provider Notes (Signed)
[facility] Emergency Department Provider Note  ____________________________________________   Event Date/Time   First MD Initiated Contact with Patient 10/29/20 2131     (approximate)  I have reviewed the triage vital signs and the nursing notes.   HISTORY  Chief Complaint Extremity Weakness    HPI Case Hunter Monroe. is a 50 y.o. male presents emergency department stating that he has some numbness and tingling that comes and goes in his hand after getting the J & J vaccination on 12/30.  He states he also had some slurred speech.  Patient does have a history of diabetes and schizophrenia.      Past Medical History:  Diagnosis Date  . Anxiety   . Asthma   . Bipolar 1 disorder (HCC)   . Depression   . Diabetes mellitus without complication (HCC)   . Homelessness   . Schizophrenia (HCC)     There are no problems to display for this patient.   History reviewed. No pertinent surgical history.  Prior to Admission medications   Medication Sig Start Date End Date Taking? Authorizing Provider  albuterol (PROVENTIL HFA;VENTOLIN HFA) 108 (90 Base) MCG/ACT inhaler Inhale 2 puffs into the lungs every 6 (six) hours as needed for wheezing or shortness of breath. 07/30/18   Jeanmarie Plant, MD  buPROPion (WELLBUTRIN XL) 300 MG 24 hr tablet Take 300 mg by mouth daily.    [provider]  hydrOXYzine (ATARAX/VISTARIL) 50 MG tablet Take 1 tablet by mouth 4 (four) times daily as needed for anxiety.  12/17/17   [provider]  nabumetone (RELAFEN) 500 MG tablet Take 1 tablet (500 mg total) by mouth 2 (two) times daily. 07/08/19   Tommi Rumps, PA-C  paliperidone (INVEGA) 9 MG 24 hr tablet Take 9 mg by mouth every morning.    [provider]  QUEtiapine (SEROQUEL) 400 MG tablet Take 1 tablet by mouth at bedtime. 12/17/17   [provider]  traZODone (DESYREL) 100 MG tablet Take 200 mg by mouth at bedtime. 12/17/17   [provider]  triamcinolone ointment (KENALOG) 0.1 % Apply 1 application topically 2 (two) times daily. 03/16/18   [name], NP    Allergies Naproxen  History reviewed. No pertinent family history.  Social History Social History   Tobacco Use  . Smoking status: Current Every Day Smoker    Packs/day: 1.00    Types: Cigarettes  . Smokeless tobacco: Never Used  Vaping Use  . Vaping Use: Never used  Substance Use Topics  . Alcohol use: Not Currently  . Drug use: No    Review of Systems  Constitutional: No fever/chills Eyes: No visual changes. ENT: No sore throat. Respiratory: Denies cough Cardiovascular: Denies chest pain Gastrointestinal: Denies abdominal pain Genitourinary: Negative for dysuria. Musculoskeletal: Negative for back pain. Skin: Negative for rash. Psychiatric: no mood changes,     ____________________________________________   PHYSICAL EXAM:  VITAL SIGNS: ED Triage Vitals  Enc Vitals Group     BP 10/29/20 2037 (!) 144/99     Pulse Rate 10/29/20 2037 94     Resp 10/29/20 2037 18     Temp 10/29/20 2037 98 F (36.7 C)     Temp Source 10/29/20 2037 Oral     SpO2 10/29/20 2037 100 %     Weight 10/29/20 2038 156 lb (70.8 kg)     Height --      Head Circumference --      Peak  Flow --      Pain Score --      Pain Loc --      Pain Edu? --      Excl. in [place]? --     Constitutional: Alert and oriented. Well appearing and in no acute distress. Eyes: Conjunctivae are normal.  Head: Atraumatic. Nose: No congestion/rhinnorhea. Mouth/Throat: Mucous membranes are moist.  Neck:  supple no lymphadenopathy noted Cardiovascular: Normal rate, regular rhythm. Heart sounds are normal Respiratory: Normal respiratory effort.  No retractions, lungs c t a  GU: deferred Musculoskeletal: FROM all extremities, warm and well perfused, no redness or swelling noted at the injection site, grips are equal bilaterally Neurologic:  Normal speech and language.   Skin:  Skin is warm, dry and intact. No rash noted. Psychiatric: Mood and affect are normal. Speech and behavior are normal.  ____________________________________________   LABS (all labs ordered are listed, but only abnormal results are displayed)  Labs Reviewed  CBG MONITORING, ED   ____________________________________________   ____________________________________________  RADIOLOGY  CT of the head  ____________________________________________   PROCEDURES  Procedure(s) performed: No  Procedures    ____________________________________________   INITIAL IMPRESSION / ASSESSMENT AND PLAN / ED COURSE  Pertinent labs & imaging results that were available during my care of the patient were reviewed by me and considered in my medical decision making (see chart for details).   Patient is 50 year old male presents emergency department with numbness tingling of the right hand along with some slurred speech after having a J & J injection on 10/27/2020.  See HPI physical exam shows patient appears stable.  Due to concerns with changing causing some side effects, I did do a CT of the head to rule out stroke.  CB G as patient is diabetic.   CBG is 76, CT of the head is negative  I did speak with the patient about all of the results.  He was given reassurance that he is okay.  He is to follow-up with his regular doctor as needed.  Return emergency department worsening.  Due to the glucose being at 76 we did give him a snack and drink prior to discharge.  Hunter Monroe. was evaluated in Emergency Department on 10/29/2020 for the symptoms described in the history of present illness. He was evaluated in the context of the global COVID-19 pandemic, which necessitated consideration that the patient might be at risk for infection with the SARS-CoV-2 virus that causes COVID-19. Institutional protocols and algorithms that pertain to the evaluation of patients at risk for COVID-19 are  in a state of rapid change based on information released by regulatory bodies including the CDC and federal and state organizations. These policies and algorithms were followed during the patient's care in the ED.    As part of my medical decision making, I reviewed the following data within the electronic MEDICAL RECORD NUMBER Nursing notes reviewed and incorporated, Labs reviewed , Old chart reviewed, Radiograph reviewed , Notes from prior ED visits and [place] Controlled Substance Database  ____________________________________________   FINAL CLINICAL IMPRESSION(S) / ED DIAGNOSES  Final diagnoses:  Weakness of extremity      NEW MEDICATIONS STARTED DURING THIS VISIT:  New Prescriptions   No medications on file     Note:  This document was prepared using Dragon voice recognition software and may include unintentional dictation errors.    Faythe Ghee, PA-C 10/29/20 2330    Sharman Cheek, MD 10/30/20 [phone]

## 2020-10-31 ENCOUNTER — Other Ambulatory Visit: Payer: Self-pay

## 2020-11-09 ENCOUNTER — Emergency Department (HOSPITAL_COMMUNITY)
Admission: EM | Admit: 2020-11-09 | Discharge: 2020-11-10 | Disposition: A | Discharge status: Home / Self Care | Payer: Medicare Other | Attending: Emergency Medicine | Admitting: Emergency Medicine

## 2020-11-09 DIAGNOSIS — R202 Paresthesia of skin: Secondary | ICD-10-CM | POA: Diagnosis present

## 2020-11-09 DIAGNOSIS — Z5321 Procedure and treatment not carried out due to patient leaving prior to being seen by health care provider: Secondary | ICD-10-CM | POA: Diagnosis not present

## 2020-11-09 NOTE — ED Triage Notes (Signed)
Pt c/o intermittent right hand numbness x 2 weeks. Pt scrolling on his cell phone with right hand at this time.

## 2020-11-10 ENCOUNTER — Other Ambulatory Visit: Payer: Self-pay

## 2020-11-10 ENCOUNTER — Emergency Department
Admission: EM | Admit: 2020-11-10 | Discharge: 2020-11-10 | Disposition: A | Discharge status: Home / Self Care | Payer: Medicare Other | Attending: Emergency Medicine | Admitting: Emergency Medicine

## 2020-11-10 DIAGNOSIS — Z5900 Homelessness unspecified: Secondary | ICD-10-CM | POA: Diagnosis not present

## 2020-11-10 DIAGNOSIS — M79671 Pain in right foot: Secondary | ICD-10-CM | POA: Insufficient documentation

## 2020-11-10 DIAGNOSIS — J45909 Unspecified asthma, uncomplicated: Secondary | ICD-10-CM | POA: Insufficient documentation

## 2020-11-10 DIAGNOSIS — F17210 Nicotine dependence, cigarettes, uncomplicated: Secondary | ICD-10-CM | POA: Insufficient documentation

## 2020-11-10 DIAGNOSIS — M79672 Pain in left foot: Secondary | ICD-10-CM | POA: Diagnosis not present

## 2020-11-10 DIAGNOSIS — G8929 Other chronic pain: Secondary | ICD-10-CM

## 2020-11-10 DIAGNOSIS — E119 Type 2 diabetes mellitus without complications: Secondary | ICD-10-CM | POA: Insufficient documentation

## 2020-11-10 MED ORDER — MELOXICAM 15 MG PO TABS: 15.0000 mg | ORAL_TABLET | Freq: Every day | ORAL | 0 refills | Status: AC

## 2020-11-10 MED ORDER — MELOXICAM 7.5 MG PO TABS
15.0000 mg | ORAL_TABLET | Freq: Every day | ORAL | Status: DC
  Administered 2020-11-10: 15 mg via ORAL

## 2020-11-10 NOTE — Discharge Instructions (Addendum)
Call and schedule an appointment with the podiatrist.

## 2020-11-10 NOTE — ED Provider Notes (Signed)
[facility] Emergency Department Provider Note ____________________________________________  Time seen: Approximately 11:41 PM  I have reviewed the triage vital signs and the nursing notes.   HISTORY  Chief Complaint Foot Pain    HPI Hunter Pileggi. is a 50 y.o. male who presents to the emergency department for evaluation and treatment of bilateral foot pain.  Pain has been present for months.  No alleviating measures attempted.  Patient is homeless and states that he is unable to get in a shelter because it is close due to COVID.   Past Medical History:  Diagnosis Date  . Anxiety   . Asthma   . Bipolar 1 disorder (HCC)   . Depression   . Diabetes mellitus without complication (HCC)   . Homelessness   . Schizophrenia (HCC)     There are no problems to display for this patient.   No past surgical history on file.  Prior to Admission medications   Medication Sig Start Date End Date Taking? Authorizing Provider  meloxicam (MOBIC) 15 MG tablet Take 1 tablet (15 mg total) by mouth daily. 11/10/20  Yes Debora Stockdale B, FNP  albuterol (PROVENTIL HFA;VENTOLIN HFA) 108 (90 Base) MCG/ACT inhaler Inhale 2 puffs into the lungs every 6 (six) hours as needed for wheezing or shortness of breath. 07/30/18   Jeanmarie Plant, MD  buPROPion (WELLBUTRIN XL) 300 MG 24 hr tablet Take 300 mg by mouth daily.    [provider]  hydrOXYzine (ATARAX/VISTARIL) 50 MG tablet Take 1 tablet by mouth 4 (four) times daily as needed for anxiety.  12/17/17   [provider]  nabumetone (RELAFEN) 500 MG tablet Take 1 tablet (500 mg total) by mouth 2 (two) times daily. 07/08/19   Tommi Rumps, PA-C  paliperidone (INVEGA) 9 MG 24 hr tablet Take 9 mg by mouth every morning.    [provider]  QUEtiapine (SEROQUEL) 400 MG tablet Take 1 tablet by mouth at bedtime. 12/17/17   [provider]  traZODone (DESYREL) 100 MG tablet Take 200 mg by mouth at  bedtime. 12/17/17   [provider]  triamcinolone ointment (KENALOG) 0.1 % Apply 1 application topically 2 (two) times daily. 03/16/18   [name], NP    Allergies Naproxen  No family history on file.  Social History Social History   Tobacco Use  . Smoking status: Current Every Day Smoker    Packs/day: 1.00    Types: Cigarettes  . Smokeless tobacco: Never Used  Vaping Use  . Vaping Use: Never used  Substance Use Topics  . Alcohol use: Not Currently  . Drug use: No    Review of Systems Constitutional: Negative for fever. Cardiovascular: Negative for chest pain. Respiratory: Negative for shortness of breath. Musculoskeletal: Positive for bilateral foot pain Skin: Negative for open wounds or lesions of the feet Neurological: Negative for decrease in sensation  ____________________________________________   PHYSICAL EXAM:  VITAL SIGNS: ED Triage Vitals [11/10/20 2040]  Enc Vitals Group     BP 118/67     Pulse Rate 80     Resp 20     Temp 98.2 F (36.8 C)     Temp Source Oral     SpO2 100 %     Weight 145 lb (65.8 kg)     Height 5\' 7"  (1.702 m)     Head Circumference      Peak Flow      Pain Score 9  Pain Loc      Pain Edu?      Excl. in GC?     Constitutional: Alert and oriented. Well appearing and in no acute distress. Eyes: Conjunctivae are clear without discharge or drainage Head: Atraumatic Neck: Supple Respiratory: No cough. Respirations are even and unlabored. Musculoskeletal: No edema or erythema of either foot.  Patient is able to perform active range of motion. Neurologic: Motor and sensory function is intact. Skin: Skin overlying the plantar aspects of the feet is very dry and flaky however there are no fissures or obvious fungal infection. Psychiatric: Affect and behavior are appropriate.  ____________________________________________   LABS (all labs ordered are listed, but only abnormal results are displayed)  Labs  Reviewed - No data to display ____________________________________________  RADIOLOGY  Not indicated  I, Kem Boroughs, personally viewed and evaluated these images (plain radiographs) as part of my medical decision making, as well as reviewing the written report by the radiologist.  No results found. ____________________________________________   PROCEDURES  Procedures  ____________________________________________   INITIAL IMPRESSION / ASSESSMENT AND PLAN / ED COURSE  Hunter Archuletta. is a 50 y.o. who presents to the emergency department for evaluation of chronic bilateral foot pain.  See HPI for further details.  He will be treated with meloxicam and encouraged to follow-up with podiatry.    Medications  meloxicam (MOBIC) tablet 15 mg (15 mg Oral Given 11/10/20 2338)    Pertinent labs & imaging results that were available during my care of the patient were reviewed by me and considered in my medical decision making (see chart for details).   _________________________________________   FINAL CLINICAL IMPRESSION(S) / ED DIAGNOSES  Final diagnoses:  Chronic pain in left foot  Chronic pain in right foot    ED Discharge Orders         Ordered    meloxicam (MOBIC) 15 MG tablet  Daily        11/10/20 2302           If controlled substance prescribed during this visit, 12 month history viewed on the [facility] prior to issuing an initial prescription for Schedule II or III opiod.   Chinita Pester, FNP 11/10/20 0865    Minna Antis, MD 11/14/20 1500

## 2020-11-10 NOTE — ED Notes (Signed)
Called pt multiple times and no answer  

## 2020-11-10 NOTE — ED Triage Notes (Signed)
Pt in with co bilat feet pain states for a long time. Hx of the same states it due to a lot of walking. Pt seen for the same multiple times, states is homeless.

## 2020-11-10 NOTE — ED Notes (Signed)
Pt given warm blanket and seated in lobby.

## 2020-11-11 ENCOUNTER — Emergency Department
Admission: EM | Admit: 2020-11-11 | Discharge: 2020-11-12 | Disposition: A | Discharge status: Home / Self Care | Payer: Medicare Other | Attending: Emergency Medicine | Admitting: Emergency Medicine

## 2020-11-11 DIAGNOSIS — F17210 Nicotine dependence, cigarettes, uncomplicated: Secondary | ICD-10-CM | POA: Insufficient documentation

## 2020-11-11 DIAGNOSIS — Z79899 Other long term (current) drug therapy: Secondary | ICD-10-CM | POA: Diagnosis not present

## 2020-11-11 DIAGNOSIS — M722 Plantar fascial fibromatosis: Secondary | ICD-10-CM | POA: Insufficient documentation

## 2020-11-11 DIAGNOSIS — M79672 Pain in left foot: Secondary | ICD-10-CM | POA: Diagnosis present

## 2020-11-11 DIAGNOSIS — M79671 Pain in right foot: Secondary | ICD-10-CM | POA: Insufficient documentation

## 2020-11-11 DIAGNOSIS — J45909 Unspecified asthma, uncomplicated: Secondary | ICD-10-CM | POA: Insufficient documentation

## 2020-11-11 DIAGNOSIS — E119 Type 2 diabetes mellitus without complications: Secondary | ICD-10-CM | POA: Insufficient documentation

## 2020-11-11 DIAGNOSIS — Z5321 Procedure and treatment not carried out due to patient leaving prior to being seen by health care provider: Secondary | ICD-10-CM | POA: Insufficient documentation

## 2020-11-11 NOTE — ED Triage Notes (Signed)
Pt states bilateral foot pain. Pt states has been going on for years. Pt states was here yesterday for same, appears in no acute distress.

## 2020-11-12 ENCOUNTER — Encounter: Payer: Self-pay | Admitting: *Deleted

## 2020-11-12 ENCOUNTER — Emergency Department
Admission: EM | Admit: 2020-11-12 | Discharge: 2020-11-12 | Discharge status: Against Medical Advice | Payer: Medicare Other | Source: Home / Self Care | Attending: Emergency Medicine | Admitting: Emergency Medicine

## 2020-11-12 DIAGNOSIS — M722 Plantar fascial fibromatosis: Secondary | ICD-10-CM | POA: Insufficient documentation

## 2020-11-12 DIAGNOSIS — J45909 Unspecified asthma, uncomplicated: Secondary | ICD-10-CM | POA: Insufficient documentation

## 2020-11-12 DIAGNOSIS — F17210 Nicotine dependence, cigarettes, uncomplicated: Secondary | ICD-10-CM | POA: Insufficient documentation

## 2020-11-12 DIAGNOSIS — E119 Type 2 diabetes mellitus without complications: Secondary | ICD-10-CM | POA: Insufficient documentation

## 2020-11-12 DIAGNOSIS — Z79899 Other long term (current) drug therapy: Secondary | ICD-10-CM | POA: Insufficient documentation

## 2020-11-12 DIAGNOSIS — M79671 Pain in right foot: Secondary | ICD-10-CM

## 2020-11-12 MED ORDER — NAPROXEN 500 MG PO TABS
500.0000 mg | ORAL_TABLET | Freq: Once | ORAL | Status: DC
  Filled 2020-11-12: qty 1

## 2020-11-12 MED ORDER — ACETAMINOPHEN 500 MG PO TABS
1000.0000 mg | ORAL_TABLET | Freq: Once | ORAL | Status: DC
  Filled 2020-11-12: qty 2

## 2020-11-12 NOTE — ED Notes (Signed)
Pt states is leaving.

## 2020-11-12 NOTE — ED Provider Notes (Signed)
[facility] Emergency Department Provider Note ____________________________________________   Event Date/Time   First MD Initiated Contact with Patient 11/12/20 2146     (approximate)  I have reviewed the triage vital signs and the nursing notes.  HISTORY  Chief Complaint Foot Pain   HPI Hunter Monroe. is a 50 y.o. malewho presents to the ED for evaluation of subacute bilateral foot pain.  Chart review indicates history of homelessness, schizophrenia.  Patient has presented to the ED for similar complaints nightly for the past 4 nights.  Patient just to the ED with 1 week of atraumatic bilateral plantar foot pain.  Patient reports 8/10 intensity pain that improves with NSAID usage, but always recurs.  Denies trauma, injury, falls, syncope or additional pains beyond his bilateral feet.  Patient reports psychosocial difficulties such that he is now currently homeless.  Denies suicidality or hallucinations.  Reports concern for upcoming snowstorm and weather, with local shelters not excepting new patients due to COVID-19 outbreak, supervised to the ED for assistance.   Past Medical History:  Diagnosis Date  . Anxiety   . Asthma   . Bipolar 1 disorder (HCC)   . Depression   . Diabetes mellitus without complication (HCC)   . Homelessness   . Schizophrenia (HCC)     There are no problems to display for this patient.   History reviewed. No pertinent surgical history.  Prior to Admission medications   Medication Sig Start Date End Date Taking? Authorizing Provider  albuterol (PROVENTIL HFA;VENTOLIN HFA) 108 (90 Base) MCG/ACT inhaler Inhale 2 puffs into the lungs every 6 (six) hours as needed for wheezing or shortness of breath. 07/30/18   Jeanmarie Plant, MD  buPROPion (WELLBUTRIN XL) 300 MG 24 hr tablet Take 300 mg by mouth daily.    [provider]  hydrOXYzine (ATARAX/VISTARIL) 50 MG tablet Take 1 tablet by mouth 4 (four) times daily as  needed for anxiety.  12/17/17   [provider]  meloxicam (MOBIC) 15 MG tablet Take 1 tablet (15 mg total) by mouth daily. 11/10/20   Triplett, Rulon Eisenmenger B, FNP  nabumetone (RELAFEN) 500 MG tablet Take 1 tablet (500 mg total) by mouth 2 (two) times daily. 07/08/19   Tommi Rumps, PA-C  paliperidone (INVEGA) 9 MG 24 hr tablet Take 9 mg by mouth every morning.    [provider]  QUEtiapine (SEROQUEL) 400 MG tablet Take 1 tablet by mouth at bedtime. 12/17/17   [provider]  traZODone (DESYREL) 100 MG tablet Take 200 mg by mouth at bedtime. 12/17/17   [provider]  triamcinolone ointment (KENALOG) 0.1 % Apply 1 application topically 2 (two) times daily. 03/16/18   [name], NP    Allergies Naproxen  History reviewed. No pertinent family history.  Social History Social History   Tobacco Use  . Smoking status: Current Every Day Smoker    Packs/day: 1.00    Types: Cigarettes  . Smokeless tobacco: Never Used  Vaping Use  . Vaping Use: Never used  Substance Use Topics  . Alcohol use: Not Currently  . Drug use: No    Review of Systems  Constitutional: No fever/chills Eyes: No visual changes. ENT: No sore throat. Cardiovascular: Denies chest pain. Respiratory: Denies shortness of breath. Gastrointestinal: No abdominal pain.  No nausea, no vomiting.  No diarrhea.  No constipation. Genitourinary: Negative for dysuria. Musculoskeletal: Negative for back pain.  Bilateral foot pain. Skin: Negative for rash. Neurological: Negative for  headaches, focal weakness or numbness.  ____________________________________________   PHYSICAL EXAM:  VITAL SIGNS: Vitals:   11/12/20 1849  BP: 123/69  Pulse: 73  Resp: 16  Temp: 98.2 F (36.8 C)  SpO2: 98%      Constitutional: Alert and oriented. Well appearing and in no acute distress. Eyes: Conjunctivae are normal. PERRL. EOMI. Head: Atraumatic. Nose: No congestion/rhinnorhea. Mouth/Throat:  Mucous membranes are moist.  Oropharynx non-erythematous. Neck: No stridor. No cervical spine tenderness to palpation. Cardiovascular: Normal rate, regular rhythm. Grossly normal heart sounds.  Good peripheral circulation. Respiratory: Normal respiratory effort.  No retractions. Lungs CTAB. Gastrointestinal: Soft , nondistended, nontender to palpation. No CVA tenderness. Musculoskeletal: No lower extremity tenderness nor edema.  No joint effusions. No signs of acute trauma. Bilateral feet without signs of trauma, erythema, bony tenderness, laceration or evidence of open injury. Bilateral feet are tender over his plantar fascia, primarily at insertion into his calcaneus.  No overlying skin changes. Full palpation of all 4 extremities otherwise without tenderness, deformity or signs of trauma. Neurologic:  Normal speech and language. No gross focal neurologic deficits are appreciated. No gait instability noted. Skin:  Skin is warm, dry and intact. No rash noted. Psychiatric: Mood and affect are normal. Speech and behavior are normal.  ____________________________________________   LABS (all labs ordered are listed, but only abnormal results are displayed)  Labs Reviewed - No data to display  ____________________________________________   PROCEDURES and INTERVENTIONS  Procedure(s) performed (including Critical Care):  Procedures  Medications  acetaminophen (TYLENOL) tablet 1,000 mg (has no administration in time range)  naproxen (NAPROSYN) tablet 500 mg (has no administration in time range)    ____________________________________________   MDM / ED COURSE   50 year old homeless gentleman presents to the ED with 1 week of acute on chronic bilateral foot pain, consistent with plantar fasciitis, and storming out of the ED prior to medication administration.  Normal vitals on room air.  Exam with stigmata of bilateral plantar fasciitis.  No evidence of trauma to necessitate imaging.   No evidence of neurovascular deficits, distress or additional acute pathology beyond his bilateral plantar feet tenderness to palpation.  Was plan to provide patient Tylenol and NSAIDs to assist with his symptoms, and outpatient measures.  Patient leaves prior to treatment completion, apparently frustrated by the registration process.      ____________________________________________   FINAL CLINICAL IMPRESSION(S) / ED DIAGNOSES  Final diagnoses:  Plantar fasciitis, bilateral  Bilateral foot pain     ED Discharge Orders    None       Kelleigh Skerritt   Note:  This document was prepared using Dragon voice recognition software and may include unintentional dictation errors.   Delton Prairie, MD 11/12/20 [phone]

## 2020-11-12 NOTE — ED Notes (Signed)
Patient refused to answer questions for registration and stormed out of treatment room prior to receiving medications.

## 2020-11-12 NOTE — ED Notes (Signed)
Pt not in lobby and no answer when called

## 2020-11-12 NOTE — ED Triage Notes (Signed)
Patient is here for bilateral foot pain x 1 week. States that he does a lot of walking. Reports pain is to his heel.

## 2020-11-12 NOTE — ED Notes (Signed)
Pt has returned.  

## 2020-11-14 ENCOUNTER — Emergency Department
Admission: EM | Admit: 2020-11-14 | Discharge: 2020-11-14 | Disposition: A | Discharge status: Home / Self Care | Payer: Medicare Other | Attending: Emergency Medicine | Admitting: Emergency Medicine

## 2020-11-14 ENCOUNTER — Other Ambulatory Visit: Payer: Self-pay

## 2020-11-14 DIAGNOSIS — J45909 Unspecified asthma, uncomplicated: Secondary | ICD-10-CM | POA: Diagnosis not present

## 2020-11-14 DIAGNOSIS — F17210 Nicotine dependence, cigarettes, uncomplicated: Secondary | ICD-10-CM | POA: Diagnosis not present

## 2020-11-14 DIAGNOSIS — M79672 Pain in left foot: Secondary | ICD-10-CM | POA: Insufficient documentation

## 2020-11-14 DIAGNOSIS — M79671 Pain in right foot: Secondary | ICD-10-CM | POA: Diagnosis present

## 2020-11-14 DIAGNOSIS — E119 Type 2 diabetes mellitus without complications: Secondary | ICD-10-CM | POA: Insufficient documentation

## 2020-11-14 NOTE — ED Provider Notes (Signed)
[facility] Emergency Department Provider Note    ____________________________________________   I have reviewed the triage vital signs and the nursing notes.   HISTORY  Chief Complaint Foot Pain   History limited by: Not Limited   HPI Hunter Monroe. is a 50 y.o. male who presents to the emergency department today because of concern for bilateral foot pain. He states he still has this pain. He says he wanted to get some medicine and that when he was here two days ago he was told he was going to get medicine however that did not happen. He does admit it did not happen because he left prior to being given the medication. He says that his feet continue to hurt. No new complaints.   Records reviewed. Per medical record review patient has a history of frequent ER visits was sen on the 13th and 15th of this month for the same complaint.   Past Medical History:  Diagnosis Date  . Anxiety   . Asthma   . Bipolar 1 disorder (HCC)   . Depression   . Diabetes mellitus without complication (HCC)   . Homelessness   . Schizophrenia (HCC)     There are no problems to display for this patient.   History reviewed. No pertinent surgical history.  Prior to Admission medications   Medication Sig Start Date End Date Taking? Authorizing Provider  albuterol (PROVENTIL HFA;VENTOLIN HFA) 108 (90 Base) MCG/ACT inhaler Inhale 2 puffs into the lungs every 6 (six) hours as needed for wheezing or shortness of breath. 07/30/18   Jeanmarie Plant, MD  buPROPion (WELLBUTRIN XL) 300 MG 24 hr tablet Take 300 mg by mouth daily.    [provider]  hydrOXYzine (ATARAX/VISTARIL) 50 MG tablet Take 1 tablet by mouth 4 (four) times daily as needed for anxiety.  12/17/17   [provider]  meloxicam (MOBIC) 15 MG tablet Take 1 tablet (15 mg total) by mouth daily. 11/10/20   Triplett, Rulon Eisenmenger B, FNP  nabumetone (RELAFEN) 500 MG tablet Take 1 tablet (500 mg total) by mouth 2  (two) times daily. 07/08/19   Tommi Rumps, PA-C  paliperidone (INVEGA) 9 MG 24 hr tablet Take 9 mg by mouth every morning.    [provider]  QUEtiapine (SEROQUEL) 400 MG tablet Take 1 tablet by mouth at bedtime. 12/17/17   [provider]  traZODone (DESYREL) 100 MG tablet Take 200 mg by mouth at bedtime. 12/17/17   [provider]  triamcinolone ointment (KENALOG) 0.1 % Apply 1 application topically 2 (two) times daily. 03/16/18   [name], NP    Allergies Naproxen  History reviewed. No pertinent family history.  Social History Social History   Tobacco Use  . Smoking status: Current Every Day Smoker    Packs/day: 1.00    Types: Cigarettes  . Smokeless tobacco: Never Used  Vaping Use  . Vaping Use: Never used  Substance Use Topics  . Alcohol use: Not Currently  . Drug use: No    Review of Systems Constitutional: No fever/chills Eyes: No visual changes. ENT: No sore throat. Cardiovascular: Denies chest pain. Respiratory: Denies shortness of breath. Gastrointestinal: No abdominal pain.  No nausea, no vomiting.  No diarrhea.   Genitourinary: Negative for dysuria. Musculoskeletal: Positive for bilateral foot pain. Skin: Negative for rash. Neurological: Negative for headaches, focal weakness or numbness.  ____________________________________________   PHYSICAL EXAM:  VITAL SIGNS: ED Triage Vitals  Enc Vitals Group  BP 11/14/20 2136 124/67     Pulse Rate 11/14/20 2136 97     Resp 11/14/20 2136 16     Temp 11/14/20 2136 98.7 F (37.1 C)     Temp Source 11/14/20 2136 Oral     SpO2 11/14/20 2136 99 %     Weight 11/14/20 2135 150 lb (68 kg)     Height 11/14/20 2135 5\' 7"  (1.702 m)     Head Circumference --      Peak Flow --      Pain Score 11/14/20 2134 7   Constitutional: Alert and oriented.  Eyes: Conjunctivae are normal.  ENT      Head: Normocephalic and atraumatic.      Nose: No congestion/rhinnorhea.       Mouth/Throat: Mucous membranes are moist.      Neck: No stridor. Hematological/Lymphatic/Immunilogical: No cervical lymphadenopathy. Cardiovascular: Normal rate, regular rhythm.  No murmurs, rubs, or gallops.  Respiratory: Normal respiratory effort without tachypnea nor retractions. Breath sounds are clear and equal bilaterally. No wheezes/rales/rhonchi. Gastrointestinal: Soft and non tender. No rebound. No guarding.  Genitourinary: Deferred Musculoskeletal: Normal range of motion in all extremities. No lower extremity edema. Neurologic:  Normal speech and language. No gross focal neurologic deficits are appreciated.  Skin:  Skin is warm, dry and intact. No rash noted. Psychiatric: Mood and affect are normal. Speech and behavior are normal. Patient exhibits appropriate insight and judgment.  ____________________________________________    LABS (pertinent positives/negatives)  None  ____________________________________________   EKG  None  ____________________________________________    RADIOLOGY  None  ____________________________________________   PROCEDURES  Procedures  ____________________________________________   INITIAL IMPRESSION / ASSESSMENT AND PLAN / ED COURSE  Pertinent labs & imaging results that were available during my care of the patient were reviewed by me and considered in my medical decision making (see chart for details).   Patient presented to the emergency department today because of concerns for continued bilateral foot pain.  He has been seen for this 2 times in the past 4 days already.  Patient without any new complaints.  I did discuss with the patient that he might have better symptomatic control if he followed up with an outpatient physician for this foot pain.  Will discharge with podiatry follow-up information.  Do think likely [place] fasciitis.  Patient left prior to obtaining discharge  paperwork. ____________________________________________   FINAL CLINICAL IMPRESSION(S) / ED DIAGNOSES  Final diagnoses:  Foot pain, bilateral     Note: This dictation was prepared with Dragon dictation. Any transcriptional errors that result from this process are unintentional     2135, MD 11/14/20 2148

## 2020-11-14 NOTE — ED Triage Notes (Signed)
Pt states "Both of my feet hurt." Denies injury, has been seen several times for same.

## 2020-11-14 NOTE — ED Notes (Signed)
Derrill Kay MD in triage evaluating patient.

## 2020-11-14 NOTE — Discharge Instructions (Addendum)
Please follow-up with the podiatrist.

## 2020-11-14 NOTE — ED Notes (Addendum)
Pt left prior to this RN being able to go over discharge paperwork. Declining repeat vital signs and unwilling to wait for discharge instructions. Refusing discharge signature.

## 2021-01-13 ENCOUNTER — Other Ambulatory Visit: Payer: Self-pay

## 2021-01-13 ENCOUNTER — Emergency Department
Admission: EM | Admit: 2021-01-13 | Discharge: 2021-01-14 | Disposition: A | Discharge status: Home / Self Care | Payer: Medicare Other | Attending: Emergency Medicine | Admitting: Emergency Medicine

## 2021-01-13 DIAGNOSIS — G8929 Other chronic pain: Secondary | ICD-10-CM

## 2021-01-13 DIAGNOSIS — E119 Type 2 diabetes mellitus without complications: Secondary | ICD-10-CM | POA: Diagnosis not present

## 2021-01-13 DIAGNOSIS — M79672 Pain in left foot: Secondary | ICD-10-CM | POA: Insufficient documentation

## 2021-01-13 DIAGNOSIS — M79671 Pain in right foot: Secondary | ICD-10-CM | POA: Insufficient documentation

## 2021-01-13 DIAGNOSIS — J45909 Unspecified asthma, uncomplicated: Secondary | ICD-10-CM | POA: Insufficient documentation

## 2021-01-13 DIAGNOSIS — F17210 Nicotine dependence, cigarettes, uncomplicated: Secondary | ICD-10-CM | POA: Insufficient documentation

## 2021-01-13 MED ORDER — PREDNISONE 20 MG PO TABS: 60.0000 mg | ORAL_TABLET | Freq: Once | ORAL | Status: DC

## 2021-01-13 NOTE — Discharge Instructions (Addendum)
You were seen today for chronic foot pain.  You received a dose of prednisone in the ER.  You may take Tylenol 1000 mg every 8 hours as needed.  Please follow-up with your PCP if symptoms persist or worsen.

## 2021-01-13 NOTE — ED Provider Notes (Signed)
[facility] Emergency Department Provider Note ____________________________________________  Time seen: 2330  I have reviewed the triage vital signs and the nursing notes.  HISTORY  Chief Complaint  Foot Pain (BIL)   HPI Hunter Monroe. is a 50 y.o. male presents to the ER today with complaint of bilateral foot pain.  He reports this is a chronic issue but was worse over the last week since he has been hitchhiking back and forth from [facility].  He describes the pain as burning.  He denies numbness, tingling or weakness.  He denies back, hip or knee pain.  He has not noticed any swelling of the lower extremities.  He reports a history of DM 2 which is borderline.  He does not take any medications for this.  He is unsure what his last A1c was.  He has never been told he has neuropathy.  He was seen at [facility] ER 3 5 for the same.  X-ray of bilateral feet were negative for acute findings.  He was treated with Tylenol and Ibuprofen and discharged.  Past Medical History:  Diagnosis Date  . Anxiety   . Asthma   . Bipolar 1 disorder (HCC)   . Depression   . Diabetes mellitus without complication (HCC)   . Homelessness   . Schizophrenia (HCC)     There are no problems to display for this patient.   History reviewed. No pertinent surgical history.  Prior to Admission medications   Medication Sig Start Date End Date Taking? Authorizing Provider  albuterol (PROVENTIL HFA;VENTOLIN HFA) 108 (90 Base) MCG/ACT inhaler Inhale 2 puffs into the lungs every 6 (six) hours as needed for wheezing or shortness of breath. 07/30/18   Jeanmarie Plant, MD  buPROPion (WELLBUTRIN XL) 300 MG 24 hr tablet Take 300 mg by mouth daily.    [provider]  hydrOXYzine (ATARAX/VISTARIL) 50 MG tablet Take 1 tablet by mouth 4 (four) times daily as needed for anxiety.  12/17/17   [provider]  meloxicam (MOBIC) 15 MG tablet Take 1 tablet (15 mg total) by mouth daily.  11/10/20   Triplett, Rulon Eisenmenger B, FNP  nabumetone (RELAFEN) 500 MG tablet Take 1 tablet (500 mg total) by mouth 2 (two) times daily. 07/08/19   Tommi Rumps, PA-C  paliperidone (INVEGA) 9 MG 24 hr tablet Take 9 mg by mouth every morning.    [provider]  QUEtiapine (SEROQUEL) 400 MG tablet Take 1 tablet by mouth at bedtime. 12/17/17   [provider]  traZODone (DESYREL) 100 MG tablet Take 200 mg by mouth at bedtime. 12/17/17   [provider]  triamcinolone ointment (KENALOG) 0.1 % Apply 1 application topically 2 (two) times daily. 03/16/18   [name], NP    Allergies Naproxen  History reviewed. No pertinent family history.  Social History Social History   Tobacco Use  . Smoking status: Current Every Day Smoker    Packs/day: 1.00    Types: Cigarettes  . Smokeless tobacco: Never Used  Vaping Use  . Vaping Use: Never used  Substance Use Topics  . Alcohol use: Not Currently  . Drug use: No    Review of Systems  Constitutional: Negative for fever, chills or body aches. Cardiovascular: Negative for chest pain or chest tightness. Respiratory: Negative for cough or shortness of breath. Musculoskeletal: Positive for bilateral foot pain.  Negative for back, hip or knee pain. Skin: Positive for callus of BL E.  Negative for redness,  rash, lesion or ulceration. Neurological: Patient reports burning sensation of bilateral feet.  Negative for focal weakness, tingling or numbness. ____________________________________________  PHYSICAL EXAM:  VITAL SIGNS: ED Triage Vitals  Enc Vitals Group     BP 01/13/21 2241 126/87     Pulse Rate 01/13/21 2241 99     Resp 01/13/21 2241 16     Temp 01/13/21 2241 98.8 F (37.1 C)     Temp Source 01/13/21 2241 Oral     SpO2 01/13/21 2241 99 %     Weight 01/13/21 2242 150 lb (68 kg)     Height 01/13/21 2242 5\' 7"  (1.702 m)     Head Circumference --      Peak Flow --      Pain Score 01/13/21 2242 6     Pain Loc --       Pain Edu? --      Excl. in GC? --     Constitutional: Alert and oriented. Well appearing and in no distress. Head: Normocephalic and atraumatic. Eyes: Conjunctivae are normal. PERRL. Normal extraocular movements Cardiovascular: Normal rate, regular rhythm.  Pedal pulses 2+ bilaterally Respiratory: Normal respiratory effort. No wheezes/rales/rhonchi. Musculoskeletal: Normal flexion, extension and rotation of the ankles.  Generalized pain with palpation of the feet and ankles.  No joint swelling noted.  Gait steady without device. Neurologic: Sensation intact to sharp and dull touch, BLE. Skin: Calluses noted of bilateral heels.  INITIAL IMPRESSION / ASSESSMENT AND PLAN / ED COURSE  Bilateral Foot Pain, Chronic:  No indication for repeat x-rays at this time He reports Tylenol is ineffective He reports allergy to NSAIDs although he was given ibuprofen 3/5 at [facility] Prednisone 60 mg p.o. x1 Recommend continue Tylenol 1000 mg every 8 hours as needed for chronic bilateral foot pain Return precautions discussed  FINAL CLINICAL IMPRESSION(S) / ED DIAGNOSES  Final diagnoses:  Chronic pain of both feet      [facility], NP 01/13/21 2352    01/15/21, MD 01/14/21 0105

## 2021-01-13 NOTE — ED Triage Notes (Signed)
Pt presents to ER c/o BIL foot pain x 1 week.  Pt states he did not injure his foot in any way.  Pt states he has been walking a lot.  Pt ambulatory in triage.  Denies any other pain at this time.

## 2021-01-14 NOTE — ED Notes (Signed)
RN went to discharge pt pt not in exam room

## 2021-01-15 ENCOUNTER — Emergency Department
Admission: EM | Admit: 2021-01-15 | Discharge: 2021-01-15 | Disposition: A | Discharge status: Home / Self Care | Payer: Medicare Other | Attending: Emergency Medicine | Admitting: Emergency Medicine

## 2021-01-15 ENCOUNTER — Other Ambulatory Visit: Payer: Self-pay

## 2021-01-15 DIAGNOSIS — E119 Type 2 diabetes mellitus without complications: Secondary | ICD-10-CM | POA: Insufficient documentation

## 2021-01-15 DIAGNOSIS — X58XXXA Exposure to other specified factors, initial encounter: Secondary | ICD-10-CM | POA: Insufficient documentation

## 2021-01-15 DIAGNOSIS — Y9301 Activity, walking, marching and hiking: Secondary | ICD-10-CM | POA: Insufficient documentation

## 2021-01-15 DIAGNOSIS — J45909 Unspecified asthma, uncomplicated: Secondary | ICD-10-CM | POA: Diagnosis not present

## 2021-01-15 DIAGNOSIS — F17210 Nicotine dependence, cigarettes, uncomplicated: Secondary | ICD-10-CM | POA: Diagnosis not present

## 2021-01-15 DIAGNOSIS — G8929 Other chronic pain: Secondary | ICD-10-CM

## 2021-01-15 DIAGNOSIS — S90821A Blister (nonthermal), right foot, initial encounter: Secondary | ICD-10-CM | POA: Insufficient documentation

## 2021-01-15 DIAGNOSIS — S90829A Blister (nonthermal), unspecified foot, initial encounter: Secondary | ICD-10-CM

## 2021-01-15 DIAGNOSIS — M79673 Pain in unspecified foot: Secondary | ICD-10-CM

## 2021-01-15 DIAGNOSIS — S90822A Blister (nonthermal), left foot, initial encounter: Secondary | ICD-10-CM | POA: Diagnosis not present

## 2021-01-15 DIAGNOSIS — S99921A Unspecified injury of right foot, initial encounter: Secondary | ICD-10-CM | POA: Diagnosis present

## 2021-01-15 NOTE — Discharge Instructions (Addendum)
You were seen today for blisters of your bilateral feet.  Please do not open these blisters up to drain them.  We have covered them for protection.  Please try to wear 2 pairs of socks to increase cushion while walking.  Please try to avoid wearing your shoes excessively tight.  We encourage elevation as much as possible.

## 2021-01-15 NOTE — ED Provider Notes (Signed)
[facility] Emergency Department Provider Note ____________________________________________  Time seen: 2200  I have reviewed the triage vital signs and the nursing notes.  HISTORY  Chief Complaint  Foot Pain   HPI Hunter Monroe. is a 50 y.o. male presents to the ER with complaint of blisters to his bilateral feet.  He noticed this earlier today.  He reports tenderness around the blisters but has not noticed any redness, warmth or drainage from the area.  He has a history of a chronic foot pain.  He is homeless and walks a lot, frequently back-and-forth to [facility] from [place].  He was seen in the ER 2 days ago for the same complaint.  He was seen at [facility] ER 1 week ago for the same complaint.  He has not taken any medications for this.  Past Medical History:  Diagnosis Date  . Anxiety   . Asthma   . Bipolar 1 disorder (HCC)   . Depression   . Diabetes mellitus without complication (HCC)   . Homelessness   . Schizophrenia (HCC)     There are no problems to display for this patient.   No past surgical history on file.  Prior to Admission medications   Medication Sig Start Date End Date Taking? Authorizing Provider  albuterol (PROVENTIL HFA;VENTOLIN HFA) 108 (90 Base) MCG/ACT inhaler Inhale 2 puffs into the lungs every 6 (six) hours as needed for wheezing or shortness of breath. 07/30/18   Jeanmarie Plant, MD  buPROPion (WELLBUTRIN XL) 300 MG 24 hr tablet Take 300 mg by mouth daily.    [provider]  hydrOXYzine (ATARAX/VISTARIL) 50 MG tablet Take 1 tablet by mouth 4 (four) times daily as needed for anxiety.  12/17/17   [provider]  meloxicam (MOBIC) 15 MG tablet Take 1 tablet (15 mg total) by mouth daily. 11/10/20   Triplett, Rulon Eisenmenger B, FNP  nabumetone (RELAFEN) 500 MG tablet Take 1 tablet (500 mg total) by mouth 2 (two) times daily. 07/08/19   Tommi Rumps, PA-C  paliperidone (INVEGA) 9 MG 24 hr tablet Take 9 mg by mouth  every morning.    [provider]  QUEtiapine (SEROQUEL) 400 MG tablet Take 1 tablet by mouth at bedtime. 12/17/17   [provider]  traZODone (DESYREL) 100 MG tablet Take 200 mg by mouth at bedtime. 12/17/17   [provider]  triamcinolone ointment (KENALOG) 0.1 % Apply 1 application topically 2 (two) times daily. 03/16/18   [name], NP    Allergies Naproxen  No family history on file.  Social History Social History   Tobacco Use  . Smoking status: Current Every Day Smoker    Packs/day: 1.00    Types: Cigarettes  . Smokeless tobacco: Never Used  Vaping Use  . Vaping Use: Never used  Substance Use Topics  . Alcohol use: Not Currently  . Drug use: No    Review of Systems  Constitutional: Negative for fever, chills or body aches. Cardiovascular: Negative for chest pain or chest tightness. Respiratory: Negative for cough or shortness of breath. Musculoskeletal: Positive for chronic bilateral foot pain.  Negative for knee, hip or back pain.  Negative for joint swelling. Skin: Positive for blisters to bilateral feet. Neurological: Patient reports burning sensation of bilateral feet.  Negative for focal weakness, tingling or numbness. ____________________________________________  PHYSICAL EXAM:  VITAL SIGNS: ED Triage Vitals  Enc Vitals Group     BP 01/15/21 2101 140/90  Pulse Rate 01/15/21 2101 90     Resp 01/15/21 2101 16     Temp --      Temp Source 01/15/21 2101 Oral     SpO2 01/15/21 2101 99 %     Weight 01/15/21 2102 149 lb 14.6 oz (68 kg)     Height 01/15/21 2102 5\' 7"  (1.702 m)     Head Circumference --      Peak Flow --      Pain Score 01/15/21 2102 8     Pain Loc --      Pain Edu? --      Excl. in GC? --     Constitutional: Alert and oriented. Well appearing and in no distress. Head: Normocephalic and atraumatic. Eyes: Conjunctivae are normal. PERRL. Normal extraocular movements Cardiovascular: Normal rate, regular  rhythm.  Pedal pulses 2+ bilaterally. Respiratory: Normal respiratory effort. No wheezes/rales/rhonchi. Musculoskeletal: Normal flexion, extension and rotation of the ankles.  Generalized pain with palpation of the feet and ankles.  No joint swelling noted.  Gait steady without device. Neurologic: Sensation intact to sharp and dull touch, BLE. Skin: 1.5 cm blister left lateral foot just inferior to the left lateral malleolus.  He has a 1 cm blister on the pad of the foot just under the fourth tarsal.  He has a 2 cm callus of the inferior/lateral aspect of the arch.  1 cm blister right lateral foot just inferior to the right lateral malleolus.  No redness, warmth or purulent drainage.  No signs or symptoms of infection.  INITIAL IMPRESSION / ASSESSMENT AND PLAN / ED COURSE  Blisters of Feet, Chronic Bilateral Foot Pain:  No evidence of infection Advised him we would not want to open these blisters at this would increase his risk for infection Blisters were dressed with Telfa and gauze wrap Advised him to try to wear 2 pairs of socks to further cushion his feet when walking. Advised him to not wear excessively tight boots Encouraged elevation as much as possible Return precautions discussed ____________________________________________  FINAL CLINICAL IMPRESSION(S) / ED DIAGNOSES  Final diagnoses:  Blister of foot, unspecified laterality, initial encounter  Chronic foot pain, unspecified laterality  .   2103, NP 01/15/21 2229    2230, MD 01/15/21 [phone]

## 2021-01-15 NOTE — ED Triage Notes (Signed)
Pt complains of bilateral foot pain and blisters to feet after walking frequently. Pt with blister noted to lateral left foot below ankle.

## 2022-10-09 ENCOUNTER — Encounter: Payer: Self-pay | Admitting: Emergency Medicine

## 2022-10-09 ENCOUNTER — Other Ambulatory Visit: Payer: Self-pay

## 2022-10-09 ENCOUNTER — Emergency Department
Admission: EM | Admit: 2022-10-09 | Discharge: 2022-10-09 | Discharge status: Against Medical Advice | Payer: Medicare Other | Attending: Emergency Medicine | Admitting: Emergency Medicine

## 2022-10-09 DIAGNOSIS — K625 Hemorrhage of anus and rectum: Secondary | ICD-10-CM | POA: Insufficient documentation

## 2022-10-09 DIAGNOSIS — Z5321 Procedure and treatment not carried out due to patient leaving prior to being seen by health care provider: Secondary | ICD-10-CM | POA: Diagnosis not present

## 2022-10-09 DIAGNOSIS — D72829 Elevated white blood cell count, unspecified: Secondary | ICD-10-CM | POA: Diagnosis not present

## 2022-10-09 LAB — BASIC METABOLIC PANEL
Anion gap: 8 (ref 5–15)
BUN: 8 mg/dL (ref 6–20)
CO2: 26 mmol/L (ref 22–32)
Calcium: 10.5 mg/dL — ABNORMAL HIGH (ref 8.9–10.3)
Chloride: 105 mmol/L (ref 98–111)
Creatinine, Ser: 0.91 mg/dL (ref 0.61–1.24)
GFR, Estimated: >60 mL/min (ref >60)
Glucose, Bld: 117 mg/dL — ABNORMAL HIGH (ref 70–99)
Potassium: 3.9 mmol/L (ref 3.5–5.1)
Sodium: 139 mmol/L (ref 135–145)

## 2022-10-09 LAB — CBC
HCT: 47 % (ref 39.0–52.0)
Hemoglobin: 14.7 g/dL (ref 13.0–17.0)
MCH: 23.3 pg — ABNORMAL LOW (ref 26.0–34.0)
MCHC: 31.3 g/dL (ref 30.0–36.0)
MCV: 74.6 fL — ABNORMAL LOW (ref 80.0–100.0)
Platelets: 234 10*3/uL (ref 150–400)
RBC: 6.3 MIL/uL — ABNORMAL HIGH (ref 4.22–5.81)
RDW: 15.1 % (ref 11.5–15.5)
WBC: 18.8 10*3/uL — ABNORMAL HIGH (ref 4.0–10.5)
nRBC: 0 % (ref 0.0–0.2)

## 2022-10-09 LAB — TYPE AND SCREEN
ABO/RH(D): AB POS
Antibody Screen: NEGATIVE

## 2022-10-09 NOTE — ED Provider Triage Note (Signed)
Emergency Medicine Provider Triage Evaluation Note  Hunter Monroe. , a 51 y.o. male  was evaluated in triage.  Patient has a history of anxiety, schizophrenia, bipolar and depression as well as diabetes, presents to the emergency department with rectal breathing that patient describes as bright red.  Denies history of GI bleeding.  States that he does not use anti-inflammatories and does not drink alcohol.  He states that he does have a history of anemia and takes supplemental iron.  No chest pain or abdominal pain.  Review of Systems  Positive: Patient has rectal bleeding.  Negative: No chest pain or abdominal pain.   Physical Exam  BP (!) 139/90 (BP Location: Right Arm)   Pulse (!) 115   Temp 98.7 F (37.1 C) (Oral)   Resp 18   Ht 5\' 7"  (1.702 m)   Wt 68 kg   SpO2 98%   BMI 23.49 kg/m  Gen:   Awake, no distress   Resp:  Normal effort  MSK:   Moves extremities without difficulty  Other:    Medical Decision Making  Medically screening exam initiated at 6:22 PM.  Appropriate orders placed.  . was informed that the remainder of the evaluation will be completed by another provider, this initial triage assessment does not replace that evaluation, and the importance of remaining in the ED until their evaluation is complete.     Hunter Monroe [place], [place] 10/09/22 1823

## 2022-10-09 NOTE — ED Provider Notes (Signed)
Patient allegedly presents for rectal bleeding the patient describes as bright red.  This history is obtained from the triage evaluation note as patient eloped prior to my evaluation.  According to nursing staff, patient was extremely aggressive using multiple racial slurs and demanding discharge papers prior to walking out.  Patient does have evidence of leukocytosis however there is no evidence of anemia.  Patient is ambulatory without difficulty out of our emergency department.   Merwyn Katos, MD 10/09/22 2329

## 2022-10-09 NOTE — ED Triage Notes (Addendum)
Pt via POV from home. Pt c/o rectal bleeding, pt states it is bright red. States he also feels constipated. Last BM was yesterday around 5:00pm. Also endorses nausea. Denies abd pain. Denies hx of hemorrhoids. Pt has hx of iron def anemia. Pt is A&OX4 and NAD

## 2022-10-09 NOTE — ED Notes (Signed)
Patient stormed out of room and stated, "Give me my discharge papers, bitch! White [employer] officer, political party. [employer] mother fucking bitch" Patient's family member followed patient out of room and asked patient to watch his mouth. Patient continued to use profanity towards staff using racial slurs and ambulatory with steady gait out of department, speaking in full, clear sentences.

## 2022-10-10 ENCOUNTER — Encounter: Payer: Self-pay | Admitting: Emergency Medicine

## 2022-10-10 ENCOUNTER — Emergency Department
Admission: EM | Admit: 2022-10-10 | Discharge: 2022-10-10 | Discharge status: Against Medical Advice | Payer: Medicare Other | Attending: Emergency Medicine | Admitting: Emergency Medicine

## 2022-10-10 ENCOUNTER — Other Ambulatory Visit: Payer: Self-pay

## 2022-10-10 DIAGNOSIS — Z5321 Procedure and treatment not carried out due to patient leaving prior to being seen by health care provider: Secondary | ICD-10-CM | POA: Insufficient documentation

## 2022-10-10 DIAGNOSIS — K625 Hemorrhage of anus and rectum: Secondary | ICD-10-CM | POA: Diagnosis not present

## 2022-10-10 NOTE — ED Notes (Signed)
No answer when called several times from lobby 

## 2022-10-10 NOTE — ED Triage Notes (Signed)
Patient ambulatory to triage with steady gait, without difficulty or distress noted; pt here earlier tonight but walked out of exam room after getting upset; pt st he was upset at his dad who "kept calling" and took it out on her; st he would like to be evaluated for his rectal bleeding; denies any pain, denies hx of same

## 2024-03-11 ENCOUNTER — Emergency Department
Admission: EM | Admit: 2024-03-11 | Discharge: 2024-03-11 | Disposition: A | Discharge status: Home / Self Care | Attending: Emergency Medicine | Admitting: Emergency Medicine

## 2024-03-11 ENCOUNTER — Emergency Department

## 2024-03-11 ENCOUNTER — Other Ambulatory Visit: Payer: Self-pay

## 2024-03-11 DIAGNOSIS — S39012A Strain of muscle, fascia and tendon of lower back, initial encounter: Secondary | ICD-10-CM | POA: Diagnosis not present

## 2024-03-11 DIAGNOSIS — M545 Low back pain, unspecified: Secondary | ICD-10-CM | POA: Diagnosis present

## 2024-03-11 DIAGNOSIS — T148XXA Other injury of unspecified body region, initial encounter: Secondary | ICD-10-CM

## 2024-03-11 MED ORDER — ACETAMINOPHEN 500 MG PO TABS: 500.0000 mg | ORAL_TABLET | Freq: Four times a day (QID) | ORAL | 0 refills | Status: AC | PRN

## 2024-03-11 MED ORDER — CYCLOBENZAPRINE HCL 10 MG PO TABS: 10.0000 mg | ORAL_TABLET | Freq: Three times a day (TID) | ORAL | 0 refills | Status: AC | PRN

## 2024-03-11 MED ORDER — TRAMADOL HCL 50 MG PO TABS
50.0000 mg | ORAL_TABLET | Freq: Once | ORAL | Status: AC
  Administered 2024-03-11: 50 mg via ORAL
  Filled 2024-03-11: qty 1
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
## 2024-03-11 NOTE — Discharge Instructions (Addendum)
 You have been diagnosed with lumbar muscle strain please take acetaminophen  1 tablet by mouth every 6 hours, Flexeril  1 tablet every 8 hours with meals.  Make an appointment with your PCP if you have new symptoms or symptoms worsen or come back to ED
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
